# Patient Record
Sex: Female | Born: 1980 | Race: Black or African American | Hispanic: No | Marital: Single | Smoking: Current every day smoker
Health system: Southern US, Community
[De-identification: ages and names within clinical notes are randomized; demographics above are authoritative.]

## PROBLEM LIST (undated history)

## (undated) DIAGNOSIS — L309 Dermatitis, unspecified: Secondary | ICD-10-CM

## (undated) DIAGNOSIS — Z8619 Personal history of other infectious and parasitic diseases: Secondary | ICD-10-CM

## (undated) DIAGNOSIS — K219 Gastro-esophageal reflux disease without esophagitis: Secondary | ICD-10-CM

## (undated) DIAGNOSIS — O139 Gestational [pregnancy-induced] hypertension without significant proteinuria, unspecified trimester: Secondary | ICD-10-CM

## (undated) DIAGNOSIS — M1990 Unspecified osteoarthritis, unspecified site: Secondary | ICD-10-CM

## (undated) DIAGNOSIS — I8390 Asymptomatic varicose veins of unspecified lower extremity: Secondary | ICD-10-CM

## (undated) DIAGNOSIS — R638 Other symptoms and signs concerning food and fluid intake: Secondary | ICD-10-CM

## (undated) DIAGNOSIS — Z973 Presence of spectacles and contact lenses: Secondary | ICD-10-CM

## (undated) DIAGNOSIS — R51 Headache: Secondary | ICD-10-CM

## (undated) DIAGNOSIS — I10 Essential (primary) hypertension: Secondary | ICD-10-CM

## (undated) HISTORY — PX: THERAPEUTIC ABORTION: SHX798

## (undated) HISTORY — PX: WISDOM TOOTH EXTRACTION: SHX21

## (undated) HISTORY — PX: ADENOIDECTOMY: SUR15

## (undated) HISTORY — DX: Personal history of other infectious and parasitic diseases: Z86.19

## (undated) HISTORY — PX: CHOLECYSTECTOMY, LAPAROSCOPIC: SHX56

## (undated) HISTORY — PX: TONSILLECTOMY: SUR1361

## (undated) HISTORY — DX: Other symptoms and signs concerning food and fluid intake: R63.8

## (undated) HISTORY — DX: Gestational (pregnancy-induced) hypertension without significant proteinuria, unspecified trimester: O13.9

## (undated) HISTORY — DX: Asymptomatic varicose veins of unspecified lower extremity: I83.90

---
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
## 1998-12-20 ENCOUNTER — Inpatient Hospital Stay (HOSPITAL_COMMUNITY): Admission: AD | Admit: 1998-12-20 | Discharge: 1998-12-20 | Payer: Self-pay | Admitting: Obstetrics & Gynecology

## 1999-01-18 ENCOUNTER — Other Ambulatory Visit: Admission: RE | Admit: 1999-01-18 | Discharge: 1999-01-18 | Payer: Self-pay | Admitting: Obstetrics and Gynecology

## 1999-03-25 ENCOUNTER — Emergency Department (HOSPITAL_COMMUNITY): Admission: EM | Admit: 1999-03-25 | Discharge: 1999-03-25 | Payer: Self-pay | Admitting: Emergency Medicine

## 2000-05-11 ENCOUNTER — Emergency Department (HOSPITAL_COMMUNITY): Admission: EM | Admit: 2000-05-11 | Discharge: 2000-05-11 | Payer: Self-pay | Admitting: Emergency Medicine

## 2000-05-11 ENCOUNTER — Encounter: Payer: Self-pay | Admitting: Emergency Medicine

## 2001-07-16 ENCOUNTER — Emergency Department (HOSPITAL_COMMUNITY): Admission: EM | Admit: 2001-07-16 | Discharge: 2001-07-16 | Payer: Self-pay | Admitting: Physical Therapy

## 2002-05-01 ENCOUNTER — Emergency Department (HOSPITAL_COMMUNITY): Admission: EM | Admit: 2002-05-01 | Discharge: 2002-05-01 | Payer: Self-pay | Admitting: Emergency Medicine

## 2002-06-21 ENCOUNTER — Inpatient Hospital Stay (HOSPITAL_COMMUNITY): Admission: AD | Admit: 2002-06-21 | Discharge: 2002-06-21 | Payer: Self-pay | Admitting: *Deleted

## 2002-09-05 ENCOUNTER — Inpatient Hospital Stay (HOSPITAL_COMMUNITY): Admission: AD | Admit: 2002-09-05 | Discharge: 2002-09-05 | Payer: Self-pay | Admitting: Obstetrics and Gynecology

## 2002-09-23 ENCOUNTER — Ambulatory Visit (HOSPITAL_COMMUNITY): Admission: RE | Admit: 2002-09-23 | Discharge: 2002-09-23 | Payer: Self-pay | Admitting: Obstetrics & Gynecology

## 2002-09-23 ENCOUNTER — Encounter: Payer: Self-pay | Admitting: Obstetrics & Gynecology

## 2002-12-20 ENCOUNTER — Encounter: Payer: Self-pay | Admitting: Obstetrics & Gynecology

## 2002-12-20 ENCOUNTER — Ambulatory Visit (HOSPITAL_COMMUNITY): Admission: RE | Admit: 2002-12-20 | Discharge: 2002-12-20 | Payer: Self-pay | Admitting: Obstetrics & Gynecology

## 2002-12-29 ENCOUNTER — Inpatient Hospital Stay (HOSPITAL_COMMUNITY): Admission: AD | Admit: 2002-12-29 | Discharge: 2002-12-29 | Payer: Self-pay | Admitting: Obstetrics & Gynecology

## 2002-12-31 ENCOUNTER — Inpatient Hospital Stay (HOSPITAL_COMMUNITY): Admission: AD | Admit: 2002-12-31 | Discharge: 2002-12-31 | Payer: Self-pay | Admitting: Obstetrics

## 2003-01-16 ENCOUNTER — Ambulatory Visit (HOSPITAL_COMMUNITY): Admission: RE | Admit: 2003-01-16 | Discharge: 2003-01-16 | Payer: Self-pay | Admitting: Obstetrics & Gynecology

## 2003-01-16 ENCOUNTER — Encounter: Payer: Self-pay | Admitting: Obstetrics & Gynecology

## 2003-02-03 ENCOUNTER — Observation Stay (HOSPITAL_COMMUNITY): Admission: AD | Admit: 2003-02-03 | Discharge: 2003-02-04 | Payer: Self-pay | Admitting: Obstetrics and Gynecology

## 2003-02-12 ENCOUNTER — Inpatient Hospital Stay (HOSPITAL_COMMUNITY): Admission: AD | Admit: 2003-02-12 | Discharge: 2003-02-12 | Payer: Self-pay | Admitting: Obstetrics & Gynecology

## 2003-02-13 ENCOUNTER — Inpatient Hospital Stay (HOSPITAL_COMMUNITY): Admission: AD | Admit: 2003-02-13 | Discharge: 2003-02-16 | Payer: Self-pay | Admitting: Obstetrics

## 2004-02-08 ENCOUNTER — Emergency Department (HOSPITAL_COMMUNITY): Admission: EM | Admit: 2004-02-08 | Discharge: 2004-02-08 | Payer: Self-pay | Admitting: Emergency Medicine

## 2004-02-22 ENCOUNTER — Emergency Department (HOSPITAL_COMMUNITY): Admission: EM | Admit: 2004-02-22 | Discharge: 2004-02-22 | Payer: Self-pay | Admitting: Emergency Medicine

## 2004-05-30 HISTORY — PX: CHOLECYSTECTOMY, LAPAROSCOPIC: SHX56

## 2004-06-04 ENCOUNTER — Emergency Department (HOSPITAL_COMMUNITY): Admission: EM | Admit: 2004-06-04 | Discharge: 2004-06-04 | Payer: Self-pay | Admitting: Emergency Medicine

## 2004-06-05 ENCOUNTER — Ambulatory Visit (HOSPITAL_COMMUNITY): Admission: RE | Admit: 2004-06-05 | Discharge: 2004-06-05 | Payer: Self-pay | Admitting: Emergency Medicine

## 2004-06-11 ENCOUNTER — Inpatient Hospital Stay (HOSPITAL_COMMUNITY): Admission: EM | Admit: 2004-06-11 | Discharge: 2004-06-14 | Payer: Self-pay | Admitting: Emergency Medicine

## 2004-06-11 ENCOUNTER — Encounter (INDEPENDENT_AMBULATORY_CARE_PROVIDER_SITE_OTHER): Payer: Self-pay | Admitting: Specialist

## 2004-08-08 ENCOUNTER — Emergency Department (HOSPITAL_COMMUNITY): Admission: EM | Admit: 2004-08-08 | Discharge: 2004-08-09 | Payer: Self-pay | Admitting: Emergency Medicine

## 2006-05-06 ENCOUNTER — Inpatient Hospital Stay (HOSPITAL_COMMUNITY): Admission: AD | Admit: 2006-05-06 | Discharge: 2006-05-07 | Payer: Self-pay | Admitting: Family Medicine

## 2006-05-30 HISTORY — PX: BREAST CYST EXCISION: SHX579

## 2006-09-26 ENCOUNTER — Ambulatory Visit (HOSPITAL_COMMUNITY): Admission: EM | Admit: 2006-09-26 | Discharge: 2006-09-26 | Payer: Self-pay | Admitting: Emergency Medicine

## 2006-11-02 ENCOUNTER — Emergency Department (HOSPITAL_COMMUNITY): Admission: EM | Admit: 2006-11-02 | Discharge: 2006-11-02 | Payer: Self-pay | Admitting: Emergency Medicine

## 2007-04-07 ENCOUNTER — Inpatient Hospital Stay (HOSPITAL_COMMUNITY): Admission: AD | Admit: 2007-04-07 | Discharge: 2007-04-07 | Payer: Self-pay | Admitting: Obstetrics & Gynecology

## 2007-08-10 ENCOUNTER — Inpatient Hospital Stay (HOSPITAL_COMMUNITY): Admission: AD | Admit: 2007-08-10 | Discharge: 2007-08-10 | Payer: Self-pay | Admitting: Obstetrics & Gynecology

## 2008-01-25 ENCOUNTER — Emergency Department (HOSPITAL_COMMUNITY): Admission: EM | Admit: 2008-01-25 | Discharge: 2008-01-25 | Payer: Self-pay | Admitting: Emergency Medicine

## 2008-03-21 ENCOUNTER — Inpatient Hospital Stay (HOSPITAL_COMMUNITY): Admission: AD | Admit: 2008-03-21 | Discharge: 2008-03-21 | Payer: Self-pay | Admitting: Obstetrics and Gynecology

## 2008-04-03 ENCOUNTER — Inpatient Hospital Stay (HOSPITAL_COMMUNITY): Admission: AD | Admit: 2008-04-03 | Discharge: 2008-04-03 | Payer: Self-pay | Admitting: Obstetrics and Gynecology

## 2008-07-23 ENCOUNTER — Inpatient Hospital Stay (HOSPITAL_COMMUNITY): Admission: AD | Admit: 2008-07-23 | Discharge: 2008-07-23 | Payer: Self-pay | Admitting: Obstetrics and Gynecology

## 2008-08-10 ENCOUNTER — Inpatient Hospital Stay (HOSPITAL_COMMUNITY): Admission: AD | Admit: 2008-08-10 | Discharge: 2008-08-11 | Payer: Self-pay | Admitting: Obstetrics and Gynecology

## 2008-09-04 ENCOUNTER — Inpatient Hospital Stay (HOSPITAL_COMMUNITY): Admission: AD | Admit: 2008-09-04 | Discharge: 2008-09-04 | Payer: Self-pay | Admitting: Obstetrics and Gynecology

## 2008-09-06 ENCOUNTER — Inpatient Hospital Stay (HOSPITAL_COMMUNITY): Admission: AD | Admit: 2008-09-06 | Discharge: 2008-09-06 | Payer: Self-pay | Admitting: Obstetrics and Gynecology

## 2008-09-17 ENCOUNTER — Inpatient Hospital Stay (HOSPITAL_COMMUNITY): Admission: AD | Admit: 2008-09-17 | Discharge: 2008-09-19 | Payer: Self-pay | Admitting: Obstetrics and Gynecology

## 2009-04-14 ENCOUNTER — Emergency Department (HOSPITAL_COMMUNITY): Admission: EM | Admit: 2009-04-14 | Discharge: 2009-04-14 | Payer: Self-pay | Admitting: Emergency Medicine

## 2010-01-28 ENCOUNTER — Emergency Department (HOSPITAL_COMMUNITY): Admission: EM | Admit: 2010-01-28 | Discharge: 2010-01-28 | Payer: Self-pay | Admitting: Emergency Medicine

## 2010-09-08 LAB — COMPREHENSIVE METABOLIC PANEL
ALT: 15 U/L (ref 0–35)
AST: 24 U/L (ref 0–37)
BUN: 12 mg/dL (ref 6–23)
CO2: 23 mEq/L (ref 19–32)
Calcium: 9.5 mg/dL (ref 8.4–10.5)
Chloride: 104 mEq/L (ref 96–112)
Potassium: 3.6 mEq/L (ref 3.5–5.1)
Sodium: 135 mEq/L (ref 135–145)
Total Bilirubin: 0.1 mg/dL — ABNORMAL LOW (ref 0.3–1.2)

## 2010-09-08 LAB — URINALYSIS, ROUTINE W REFLEX MICROSCOPIC
Bilirubin Urine: NEGATIVE
Hgb urine dipstick: NEGATIVE
Ketones, ur: 15 mg/dL — AB
Specific Gravity, Urine: 1.02 (ref 1.005–1.030)
Urobilinogen, UA: 1 mg/dL (ref 0.0–1.0)
pH: 7 (ref 5.0–8.0)

## 2010-09-08 LAB — URINE MICROSCOPIC-ADD ON

## 2010-09-08 LAB — CBC
Hemoglobin: 10 g/dL — ABNORMAL LOW (ref 12.0–15.0)
Hemoglobin: 10.1 g/dL — ABNORMAL LOW (ref 12.0–15.0)
Hemoglobin: 8.3 g/dL — ABNORMAL LOW (ref 12.0–15.0)
MCHC: 33.8 g/dL (ref 30.0–36.0)
MCV: 88.9 fL (ref 78.0–100.0)
MCV: 89.2 fL (ref 78.0–100.0)
Platelets: 243 10*3/uL (ref 150–400)
RBC: 3.33 MIL/uL — ABNORMAL LOW (ref 3.87–5.11)
RDW: 15.6 % — ABNORMAL HIGH (ref 11.5–15.5)
RDW: 15.9 % — ABNORMAL HIGH (ref 11.5–15.5)
WBC: 10 10*3/uL (ref 4.0–10.5)
WBC: 8.8 10*3/uL (ref 4.0–10.5)

## 2010-09-08 LAB — URIC ACID: Uric Acid, Serum: 4.1 mg/dL (ref 2.4–7.0)

## 2010-09-09 LAB — STREP B DNA PROBE

## 2010-09-09 LAB — GC/CHLAMYDIA PROBE AMP, GENITAL: GC Probe Amp, Genital: NEGATIVE

## 2010-09-14 LAB — URINALYSIS, ROUTINE W REFLEX MICROSCOPIC
Bilirubin Urine: NEGATIVE
Glucose, UA: NEGATIVE mg/dL
Hgb urine dipstick: NEGATIVE
Ketones, ur: NEGATIVE mg/dL
Nitrite: NEGATIVE
Urobilinogen, UA: 0.2 mg/dL (ref 0.0–1.0)

## 2010-09-17 ENCOUNTER — Other Ambulatory Visit: Payer: Self-pay | Admitting: Obstetrics and Gynecology

## 2010-09-17 DIAGNOSIS — N63 Unspecified lump in unspecified breast: Secondary | ICD-10-CM

## 2010-09-17 LAB — RPR: RPR: NONREACTIVE

## 2010-09-17 LAB — ANTIBODY SCREEN: Antibody Screen: NEGATIVE

## 2010-09-17 LAB — HEPATITIS B SURFACE ANTIGEN: Hepatitis B Surface Ag: NEGATIVE

## 2010-09-17 LAB — ABO/RH: RH Type: POSITIVE

## 2010-09-22 ENCOUNTER — Other Ambulatory Visit: Payer: Self-pay

## 2010-09-24 ENCOUNTER — Ambulatory Visit
Admission: RE | Admit: 2010-09-24 | Discharge: 2010-09-24 | Disposition: A | Discharge status: Home / Self Care | Payer: Medicaid Other | Source: Ambulatory Visit | Attending: Obstetrics and Gynecology | Admitting: Obstetrics and Gynecology

## 2010-09-24 DIAGNOSIS — N63 Unspecified lump in unspecified breast: Secondary | ICD-10-CM

## 2010-10-12 NOTE — H&P (Signed)
NAMEBRISHA, MCCABE                  ACCOUNT NO.:  [id number]   MEDICAL RECORD NO.:  [id number]          PATIENT TYPE:  INP   LOCATION:  9163                          FACILITY:  [facility]   PHYSICIAN:  Naima A. Dillard, M.D. DATE OF BIRTH:  Sep 11, 1980   DATE OF ADMISSION:  09/17/2008  DATE OF DISCHARGE:                              HISTORY & PHYSICAL   HISTORY OF PRESENT ILLNESS:  Connie [name] is a 30 year old gravida 3, para  1 at 39.[redacted] weeks gestation who is followed by the physicians at [facility] who presents this evening for induction of labor  secondary to [facility].  Ms. Wegman pregnancy is remarkable for increased  BMI, history of left breast infection with a ruptured abscess, asthma,  history of hypertension, history of anemia, positive GBS.  She is  missing her whole health history in these records.   DICTATION ENDED      Eulogio Bear, CNM      Naima A. Normand Sloop, M.D.  Electronically Signed    JM/MEDQ  D:  09/18/2008  T:  09/18/2008  Job:  [id number]

## 2010-10-12 NOTE — H&P (Signed)
NAMEJESLIE, LOWE                  ACCOUNT NO.:  [id number]   MEDICAL RECORD NO.:  [id number]          PATIENT TYPE:  INP   LOCATION:                                [facility]   PHYSICIAN:  Janine Limbo, M.D.DATE OF BIRTH:  10-26-1980   DATE OF ADMISSION:  09/17/2008  DATE OF DISCHARGE:                              HISTORY & PHYSICAL   DATE OF ADMISSION:  September 17, 2008.   HISTORY OF PRESENT ILLNESS:  Ms. Manalo is a 30 year old female, gravida  3, para 1-0-1-1, who presents at 43 weeks' gestation (EDC is September 24, 2008).  The patient has been followed at the [facility].  This  pregnancy has been complicated by hypertension.  The patient also is  obese.  The patient has had antenatal testing and the testing has been  reassuring.  The patient had an ultrasound performed on September 15, 2008,  which showed a single intrauterine gestation.  The fetal head was in a  vertex presentation.  The biophysical profile score was 8/8.  The  amniotic fluid volume was normal.  A 24-hour urine study showed 195 mg  of protein per day.  The patient is currently taking Aldomet 500 mg  twice each day.   OBSTETRICAL HISTORY:  The patient had a vaginal delivery at 45 weeks'  gestation in 2004, that infant weighed 6 pounds and 9 ounces.  She had  an elective pregnancy termination in 1996.   DRUG ALLERGIES:  DILAUDID.  The patient is also allergic to LATEX.   PAST MEDICAL HISTORY:  The patient has a history of seasonal allergies.  She has a history of asthma, but has done well during this pregnancy.  She uses ProAir as needed.  The patient has had a breast infection in  the past and she has had gastroenteritis in the past.  The patient has  had her adenoids removed at age 62.  Her tonsils were removed at age 32.  Her gallbladder was removed in 2007.  The patient had a mass removed  from her left breast in 2008.   SOCIAL  HISTORY:  The patient smokes cigarettes.  She denies alcohol and  other recreational drug uses.   REVIEW OF SYSTEMS:  Normal pregnancy complaints.   FAMILY HISTORY:  Noncontributory.   PHYSICAL EXAMINATION:  VITAL SIGNS:  Height is 5 feet 9 inches and  weight is 277 pounds.  HEENT:  Within normal limits.  CHEST:  Clear.  HEART:  Regular rate and rhythm.  BREASTS:  Without masses.  ABDOMEN:  Gravid with fundal height of 38 cm.  EXTREMITIES:  Grossly normal.  NEUROLOGIC:  Grossly normal.  PELVIC:  Cervix was fingertip dilated, 50% effaced, and -3 in station.   LABORATORY VALUES:  Blood type is A+, antibody screen negative, VDRL  nonreactive, rubella immune, HBsAg negative, HIV nonreactive.  Third  trimester beta strep is negative.   ASSESSMENT:  1. A 39 weeks' gestation.  2. Hypertension.  3. Obesity.  4. Asthma.  5. Unfavorable  cervix.   PLAN:  The patient will be admitted for cervical ripening, and then we  will proceed with induction on the morning of September 18, 2008.      Janine Limbo, M.D.  Electronically Signed     AVS/MEDQ  D:  09/15/2008  T:  09/16/2008  Job:  [id number]

## 2010-10-12 NOTE — H&P (Signed)
NAMEMYKIAH, SCHMUCK                  ACCOUNT NO.:  [id number]   MEDICAL RECORD NO.:  [id number]          PATIENT TYPE:  INP   LOCATION:  9163                          [facility]  [facility]   PHYSICIAN:  Naima A. Dillard, M.D. DATE OF BIRTH:  [birth year]   DATE OF ADMISSION:  09/17/2008  DATE OF DISCHARGE:                              HISTORY & PHYSICAL   CHIEF COMPLAINT AND HISTORY OF THE PRESENT ILLNESS:  The patient is a 30-  year-old gravida 3, para 1 who is 39.[redacted] weeks gestation and has been  followed by the doctors at [facility].  She presents today  for induction of labor secondary to [facility].  Her pregnancy is remarkable  for:  1. BMI.  2. History of anemia and current anemia.  3. History of asthma and current asthma.  4. History of pregnancy-induced hypertension and current pregnancy-      induced hypertension.  5. The patient is a smoker.  6. Status post cholecystectomy.  7. History of left breast surgery in 2008 and left breast ruptured      abscess in 2009.  8. History of STDs including Chlamydia and gonorrhea; none current      during this pregnancy.  9. History of positive GBS; currently negative GBS this pregnancy.  10.The patient is a smoker.   PRENATAL LABORATORY DATA:  The patient's prenatal labs include:  initial  hemoglobin of 10.3 and hematocrit 31.1.  Blood type A+, antibody screen  negative.  RPR nonreactive.  Rubella immune.  Hepatitis B negative.  HIV  nonreactive.  First trimester screen was normal.  AFP was negative.  The  final cultures done at 34 weeks were negative for beta Strep, Chlamydia  and gonorrhea.   CURRENT MEDICATIONS:  The patient's medications include:  1. Prenatal vitamin daily.  2. Protonix 40 mg daily.  3. Phenergan 25 mg as needed.  4. Albuterol inhaler.  5. Methyldopa 500 mg twice daily, but she stopped taking this about a      week ago.  6. The patient has also been prescribed Flexeril and Vicodin for      pelvic pain and  pressure, but she has not been taking these.   HISTORY OF THE PRESENT PREGNANCY:  The patient presented for prenatal  care in September beginning with the new OB interview.  She did have an  ultrasound and had her gestation adjusted down to a younger age.  So,  she was brought in at 9 weeks when she first had her interview.  She was  10 weeks at the new OB exam.  At that time she was on antibiotics for a  soft tissue infection in her left breast, which had a ruptured abscess  near the nipple.  She had a normal Pap back in May 2009 so that was not  repeated.  She was found to be anemic already and was encouraged to  start her iron twice a day, and to be pretty proactive in preventing  constipation since she suffers with chronic constipation.  At 12 weeks  she had some itching  and was using over-the-counter month Monistat-7.  Wet prep showed that she had a yeast infection and she was switched over  to [employer], was also given some Diflucan and a prescription for  Phenergan and Protonix.  She had her first trimester screen that was  normal.  At 15 weeks she began to have chronic pelvic pain, which  persisted through the rest of the pregnancy resulting in multiple visits  to the Maternity Admissions Unit.  Her AFP was done at 15 weeks and was  normal.  She got the H1N1 vaccine at that time as well.  At 16 weeks she  was given a prescription for Zofran and some Vicodin for her pelvic  pain.  At 19 weeks she had an anatomy ultrasound that showed size  consistent with dates, cervix 3.49 cm, all anatomy normal, and an  anterior placenta with a three-vessel cord.  She was suffering from  constipation and was advised to take MiraLax.  She was complaining of  numbness in her left arm from work and was referred to an orthopedic  specialist for evaluation, and given a work note.  At 26 weeks she was  complaining of pelvic pain and pressure, and a fetal fibronectin was  done, which was negative.  Her  1-hour Glucola was done and it was normal  at 117.  At 33 weeks she was seen in the Maternity Admissions Unit for  contractions and pelvic pain.  At 34 weeks she was seen in the Maternity  Admissions Unit for pelvic pain.  She had cultures that were done, beta  Strep, chlamydia and gonorrhea that were negative.  She had another  ultrasound that showed the baby was vertex, fluid was normal and  estimated fetal weight was in the 67th percentile.  At 36 weeks she was  having problems with her blood pressure and a preeclampsia panel was  drawn; it was all normal.  A 24-hour urine was done and it was fine.  She was complaining of headaches.  She had a normal exam with normal  reflexes and she was started on Aldomet 250 mg twice a day by Dr.  Stefano Gaul; and, that was at 36 weeks.  Later on at 36 weeks she was  reminded again to please take her iron and her Aldomet was increased to  500 twice a day.  There was another ultrasound done, which showed a  vertex presentation, normal fluid, a BPP of 8/8, the cervix was  fingertip, 50%, and -30; that was on September 01, 2008.  however, on September 04, 2008 she went to the Maternity Admissions Unit with high blood  pressure and pelvic pain; and,  the evening of September 05, 2008 into the  morning of September 06, 2008.  She was seen again in the Maternity  Admissions Unit requesting induction of labor at 37 weeks due to the  pelvic pain and pressure.  She was evaluated and had a reactive NST, a  BPP of 8/8 and an AFI of 20.7; and, was being monitored when she left  against medical advice.  She has been followed since then 1-2 times a  week in the office for the past week-and-a-half until this evening when  she presented for induction of labor at 39 weeks.   PAST OBSTETRICAL HISTORY:  This is the patient's third pregnancy.  Her  first pregnancy was in 1996 and was terminated in the first trimester  without any complications.  Second pregnancy was completed in  September  2004 with the birth of a son at 55 weeks.  She was induced for her blood  pressure and had a spontaneous vaginal delivery.  There was some  meconium in the fluid.  Pregnancy #3 is the current pregnancy.   ALLERGIES:  THE PATIENT IS ALLERGIC TO DILAUDID AND LATEX.   PAST MEDICAL HISTORY:  The patient has used Depo-Provera for birth  control in the past.  She has a history of Chlamydia and gonorrhea in  2001.  She had a history of positive GBS with her son in 2004.  She has  a history of anemia, high blood pressure, asthma, gastrointestinal  problems, and smoking.   PAST SURGICAL HISTORY:  1. The patient had her adenoids removed at age 35.  2. The patient had her tonsils removed at age 39.  3. The patient had termination of a pregnancy at age 67.  14. The patient had her gallbladder removed in 2007.  5. The patient had a lumpectomy of her left breast in 2008.   FAMILY HISTORY:  The patient's maternal grandmother had a heart attack.  Her maternal uncle also had heart attack.  Her maternal grandmother has  chronic hypertension and her maternal grandmother also has varicose  veins.  Her paternal grandmother has lung cancer.  Her mother has  obsessive compulsive disorder with depression.  Her maternal uncle is a  crack cocaine addict.  Her mother and father smoke as do her paternal  aunts.   GENETIC HISTORY:  The genetic history is noncontributory.  I do not see  a sickle cell screen in the records.  The AFP was normal and the first  trimester screen was normal.   SOCIAL HISTORY:  The patient is a single [place] American woman with a  high school diploma.  She is taking some college classes.  The father of  the baby is listed as Balinda Quails. Alexander Adger.  He has a GED.  I have  not met him and do not know any thing more about him other than he is  also a Consulting civil engineer.  She is a smoker, and she denies the use of alcohol or  street drugs during this pregnancy.  She does not state her  religion.   PHYSICAL EXAMINATION:  GENERAL APPEARANCE:  The patient is alert and  oriented.  HEENT:  The head, eyes, ears, nose, and throat are within normal limits.  LUNGS:  Her lungs do not sound good this evening.  She has been  suffering with her asthma using her inhaler 2-3 times a day.  She is  coughing; this is a nonproductive cough.  She does have some  adventitious breath sounds in the left lung.  The right lung is clear.  VITAL SIGNS:  Temperature 98.2, pulse 73, respiratory rate 22 and her  blood pressure has ranged in the 140s over 90s to as high as 164/107.  NEUROLOGIC AND GENERAL EXAMINATIONS:  The patient does not have any CNS  changes this evening.  She denies headache, visual changes, chest pain  and epigastric pain.  She does have numbness in her hands, but no  swelling.  ABDOMEN:  The patient's abdomen is gravid and appropriate for  gestational age.  It is soft to palpation.  EXTREMITIES:  The patient's DTRs are normal.  She has negative clonus.  She has maybe trace edema in her left lower extremity.  VAGINAL EXAMINATION:  She does not have any bleeding or leaking from her  vagina.  Fetal heart rate is normal and reactive with a baseline of 135.  Contractions are very irregular, mild and some irritability is noted on  the tracing.   IMPRESSION:  The patient is a 30 year old gravida 3, para 1, 0, 1, 1 at  39.0 weeks with:  1. Hypertension.  2. Asthma.  3. Negative group B Streptococcus.  4. Anemia.  5. Cough.  6. Unfavorable cervix per examination by Dr. Stefano Gaul in the office;      and, cervical exam is deferred tonight   PLAN:  1. Admit for induction of labor beginning with Cytotec and progressing      to low-dose Pitocin in the morning.  2. Preeclampsia panel was ordered with an LDH, uric acid and complete      metabolic panel.  3. Routine admission labs.  4. Since she has not taken her blood pressure medication and a week,      500 mg of Aldomet by mouth  times one was ordered by Dr. Normand Sloop.  5. Albuterol nebulizer treatment per respiratory therapy and an      inhaler to be kept at the bedside.  6. Cough medicine.  7. Sleep medicine.  8. Cytotec every 4 hours per protocol overnight.  9. Intravenous pain medicine as needed.     Janna Melsness, CNM      Naima A. Normand Sloop, M.D.  Electronically Signed   JM/MEDQ  D:  09/18/2008  T:  09/18/2008  Job:  [id number]

## 2010-10-15 NOTE — Op Note (Signed)
Connie [name], Connie [name]                  ACCOUNT NO.:  [id number]   MEDICAL RECORD NO.:  [id number]          PATIENT TYPE:  INP   LOCATION:  0113                         FACILITY:  [facility]   PHYSICIAN:  Sharlet Salina T. Hoxworth, M.D.DATE OF BIRTH:  Dec 06, 1980   DATE OF PROCEDURE:  09/26/2006  DATE OF DISCHARGE:                               OPERATIVE REPORT   PRE AND POSTOPERATIVE DIAGNOSIS:  Left breast abscess.   SURGICAL PROCEDURE:  I&D left breast abscess.   SURGEON:  Lorne Skeens. Hoxworth, M.D.   ANESTHESIA:  General.   BRIEF HISTORY:  Connie [name] is a 30 year old female who presents with  five days of increasing pain and swelling, the subareolar area of the  left breast.  Examination reveals a good-sized approximately 2.5 cm  abscess just beneath and inferior to the left nipple.  She has no  history of any similar problems.  Options for drainage under local or  general anesthesia were discussed and she has refused drainage under  local anesthesia.  She is therefore brought to the operating room for  drainage under general anesthesia.  Risks of bleeding, infection,  anesthetic complications were discussed understood.   DESCRIPTION OF PROCEDURE:  The patient brought operating room placed in  supine position on operating table and general anesthesia induced.  The  left breast was sterilely prepped and draped.  Made a curvilinear  incision just beneath the left nipple and areola and the abscess cavity  entered and a large amount of purulent material drained.  This was  cultured.  The cavity was widely unroofed, irrigated and hemostasis  obtained.  The soft tissue was infiltrated with Marcaine.  The cavity  was packed with iodoform gauze and dry sterile dressing was applied.  She was taken recovery in good condition.      Lorne Skeens. Hoxworth, M.D.  Electronically Signed     BTH/MEDQ  D:  09/26/2006  T:  09/27/2006  Job:  [id number]

## 2010-10-15 NOTE — Discharge Summary (Signed)
[name], [name]                  ACCOUNT NO.:  [id number]   MEDICAL RECORD NO.:  [id number]          PATIENT TYPE:  INP   LOCATION:  0456                         FACILITY:  [facility]   PHYSICIAN:  Sandria Bales. Ezzard Standing, M.D.  DATE OF BIRTH:  1980-10-22   DATE OF ADMISSION:  06/11/2004  DATE OF DISCHARGE:  06/14/2004                                 DISCHARGE SUMMARY   DISCHARGE DIAGNOSES:  1.  Chronic cholecystitis with cholelithiasis.  2.  Choledocholithiasis.  3.  Elevated liver functions secondary to choledocholithiasis  4.  History of asthma.  5.  Smokes 1/2 pack of cigarettes a day.   OPERATION PERFORMED:  The patient had a laparoscopic cholecystectomy and  intraoperative cholangiogram, common bile duct exploration with a #4 Fogarty  on June 12, 2004.   HISTORY OF PRESENT ILLNESS:  This is a 30 year old black female with no  primary medical physician who has had vague abdominal pain since August of  2005.  She went to the [facility] emergency room in September, 2005 but  left with no specific diagnosis.  Represented on June 04, 2004 and an  ultrasound on June 05, 2004 revealed multiple gallstones. She now presents  on June 11, 2004 with increasing abdominal pain. Her temperature was  98.9, she had some right upper quadrant soreness on physical exam. Her white  blood count was 8800.   Other past medical history reveals she has asthma for which she takes  albuterol nebulizers and inhalers occasionally. She smokes 1/2 pack of  cigarettes a day and knows this is bad for her health but she has no primary  care physician who manages her care.   On June 12, 2004 she was taken to the operating room where she underwent  a laparoscopic cholecystectomy with intraoperative cholangiogram. She had  what was felt to be a single stone in her distal common bile duct which was  cleared with a #4 Fogarty.  The day after surgery, June 13, 2004, her temperature was 97.9, but she  bumped her liver functions up to an AST of 729 and an ALT of 786, but a  total bilirubin of 0.8, so I kept her an additional day to make sure her  liver functions returned to normal. Today is June 14, 2004.  Her AST is  300, her ALT is 505, alkaline phosphatase 102, total bilirubin 0.5.  She  feels good and is ready for discharge.   DISCHARGE INSTRUCTIONS:  Vicodin for pain. She is to stay on a low fat diet.  She can shower later today. She should have no heavy lifting for 2 or 3  days.  I have also instructed her with a history of asthma and smoking that  she would be best served by having a primary medical physician that she  needs to identify. She will make an appointment in the office in 2 weeks.   DISCHARGE CONDITION:  Good.     Connie [name]   DHN/MEDQ  D:  06/14/2004  T:  06/14/2004  Job:  [id number]

## 2010-10-15 NOTE — Op Note (Signed)
Connie [name], Connie [name]                  ACCOUNT NO.:  [id number]   MEDICAL RECORD NO.:  [id number]          PATIENT TYPE:  INP   LOCATION:  0456                         FACILITY:  [facility]   PHYSICIAN:  Sandria Bales. Ezzard Standing, M.D.  DATE OF BIRTH:  1981-02-08   DATE OF PROCEDURE:  06/12/2004  DATE OF DISCHARGE:                                 OPERATIVE REPORT   PREOPERATIVE DIAGNOSIS:  Cholecystitis and cholelithiasis.   POSTOPERATIVE DIAGNOSIS:  Chronic cholecystitis and cholelithiasis,  choledocholithiasis.   OPERATION PERFORMED:  1.  Laparoscopic cholecystectomy.  2.  Intraoperative cholangiogram.  3.  Common bile duct exploration with #4 Fogarty catheter.   SURGEON:  Sandria Bales. Ezzard Standing, M.D.   ASSISTANT:  Sharlet Salina T. Hoxworth, M.D.   ANESTHESIA:  General endotracheal.   ESTIMATED BLOOD LOSS:  Minimal.   INDICATIONS FOR PROCEDURE:  Connie [name] is a 30 year old black female who was  admitted last evening, June 11, 2004, with what looked like  cholecystitis.  She now comes to the operating room for attempted  laparoscopic cholecystectomy.  I discussed with her the indications and  potential complications of the operation.  Potential complications include  but are not limited to bleeding, infection, bile duct injury and the  possibility of open surgery.   DESCRIPTION OF PROCEDURE:  With the patient in supine position, given  general endotracheal anesthetic.  She was also on Ancef as antibiotic and  PAS stockings in place.  Her abdomen was prepped with Betadine solution adn  sterilely draped.  Four abdominal trocars were placed.  A 12 mm Hasson  trocar in the infraumbilical incision, a 10 mm Ethicon trocar in the  subxiphoid and  two 5 right subcostal trocars.   Abdominal exploration carried out and revealed the stomach unremarkable.  Both lobes of the liver were unremarkable.  There was adhesion to the right  colon but otherwise there was no specific mass or lesion.  The gallbladder  was noted to be distended.  It was grasped and rotated cephalad.  Sharp  dissection carried down along the gallbladder cystic duct junction.  Had  quite a bit of fibrous tissue both of the duodenum stuck up to the  gallbladder and around the cystic duct.  I finally isolated the cystic  artery which was clipped and divided it.  I isolated the cystic duct, placed  a clip on the gallbladder side and shot an intraoperative cholangiogram.   The intraoperative cholangiogram was obtained using a cut off taut catheter  inserted through a cut cystic duct and secured with an angioclip.  The  catheter was inserted and placed in to the cut cystic duct and secured with  an EndoClip.  I used half strength Hypaque solution, injected contrast under  fluoroscopy.  I noted a dilated common bile duct.  It went up both radicals  but did not empty into the duodenum and there appeared to be a small  meniscal sac consistent with probable choledocholithiasis.  She did have  some mildly elevated liver functions preoperatively.   I then gave her a gram of glucagon and waited  two minutes and then I used a  #4 Fogarty catheter which I placed into the cut cystic duct and this was  passed down into the duodenum.  I actually could feel a pop when it went  into the duodenum.  I wonder whether I pushed a small stone or debris out of  the common bile duct.  I then blew the balloon up and pulled it back twice  and shot a second cholangiogram again with the cut off taut catheter and  used about 10 mL of half strength Hypaque.  This time the contrast filled up  the common bile duct but it was left decompressed.  It easily emptied into  the duodenum.  There was no obstructing mass or lesion and she was felt to  have a probable choledocholithiasis.  I saw no filling defect within the  common bile duct and I thought I had maybe pushed this on downstream.   The cystic duct was then triply endoclipped and divided.  The artery  had  already been clipped at the posterior branch that I clipped.  The  gallbladder was then bluntly and sharply dissected from the gallbladder bed,  prior to complete division of the gallbladder from the gallbladder bed, I  visualized the triangle of Calot.  I visualized the gallbladder bed, saw no  bleeding, no bile leak.  The gallbladder was then delivered into an  EndoCatch bag and delivered through the umbilicus.  The abdomen was then  irrigated with 1L of saline.  The gallbladder bed was then revisualized,  again looked good.  Each trocar was then removed in turn.   I closed the umbilical cord with 0 Vicryl suture which was already in place.  I closed the skin flap with a 4-0 Monocryl suture, painted each wound with  tincture of Benzoin and steri-stripped.  Sponge and needle counts were  correct at the end of this case.  The patient was then transferred to the  recovery room in good condition.     [name]   DHN/MEDQ  D:  06/12/2004  T:  06/12/2004  Job:  [id number]
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
## 2010-11-23 ENCOUNTER — Inpatient Hospital Stay (HOSPITAL_COMMUNITY)
Admission: AD | Admit: 2010-11-23 | Discharge: 2010-11-26 | DRG: 781 | Disposition: A | Discharge status: Home / Self Care | Payer: Medicaid Other | Source: Ambulatory Visit | Attending: Obstetrics and Gynecology | Admitting: Obstetrics and Gynecology

## 2010-11-23 DIAGNOSIS — IMO0002 Reserved for concepts with insufficient information to code with codable children: Secondary | ICD-10-CM | POA: Diagnosis present

## 2010-11-23 DIAGNOSIS — R51 Headache: Secondary | ICD-10-CM | POA: Diagnosis present

## 2010-11-23 DIAGNOSIS — M436 Torticollis: Secondary | ICD-10-CM | POA: Diagnosis present

## 2010-11-23 DIAGNOSIS — M542 Cervicalgia: Secondary | ICD-10-CM | POA: Diagnosis present

## 2010-11-23 DIAGNOSIS — R509 Fever, unspecified: Secondary | ICD-10-CM | POA: Diagnosis present

## 2010-11-23 DIAGNOSIS — O10019 Pre-existing essential hypertension complicating pregnancy, unspecified trimester: Secondary | ICD-10-CM | POA: Diagnosis present

## 2010-11-23 DIAGNOSIS — R1310 Dysphagia, unspecified: Secondary | ICD-10-CM | POA: Diagnosis present

## 2010-11-23 DIAGNOSIS — G039 Meningitis, unspecified: Secondary | ICD-10-CM

## 2010-11-23 DIAGNOSIS — O99891 Other specified diseases and conditions complicating pregnancy: Principal | ICD-10-CM | POA: Diagnosis present

## 2010-11-23 DIAGNOSIS — J029 Acute pharyngitis, unspecified: Secondary | ICD-10-CM | POA: Diagnosis present

## 2010-11-23 LAB — DIFFERENTIAL
Basophils Absolute: 0 10*3/uL (ref 0.0–0.1)
Basophils Relative: 0 % (ref 0–1)
Eosinophils Relative: 0 % (ref 0–5)
Lymphocytes Relative: 16 % (ref 12–46)
Monocytes Absolute: 0.7 10*3/uL (ref 0.1–1.0)
Monocytes Relative: 6 % (ref 3–12)
Neutro Abs: 9.5 10*3/uL — ABNORMAL HIGH (ref 1.7–7.7)

## 2010-11-23 LAB — URINALYSIS, ROUTINE W REFLEX MICROSCOPIC
Bilirubin Urine: NEGATIVE
Glucose, UA: NEGATIVE mg/dL
Hgb urine dipstick: NEGATIVE
Ketones, ur: 15 mg/dL — AB
pH: 7 (ref 5.0–8.0)

## 2010-11-23 LAB — CBC
HCT: 28.5 % — ABNORMAL LOW (ref 36.0–46.0)
Hemoglobin: 9.6 g/dL — ABNORMAL LOW (ref 12.0–15.0)
MCH: 28.8 pg (ref 26.0–34.0)
MCHC: 33.7 g/dL (ref 30.0–36.0)
RBC: 3.33 MIL/uL — ABNORMAL LOW (ref 3.87–5.11)

## 2010-11-24 DIAGNOSIS — R51 Headache: Secondary | ICD-10-CM

## 2010-11-24 DIAGNOSIS — R509 Fever, unspecified: Secondary | ICD-10-CM

## 2010-11-24 DIAGNOSIS — IMO0002 Reserved for concepts with insufficient information to code with codable children: Secondary | ICD-10-CM

## 2010-11-24 DIAGNOSIS — M542 Cervicalgia: Secondary | ICD-10-CM

## 2010-11-24 LAB — CBC
Hemoglobin: 9.2 g/dL — ABNORMAL LOW (ref 12.0–15.0)
MCH: 28.8 pg (ref 26.0–34.0)
MCHC: 33.8 g/dL (ref 30.0–36.0)
MCV: 85.3 fL (ref 78.0–100.0)
RBC: 3.19 MIL/uL — ABNORMAL LOW (ref 3.87–5.11)

## 2010-11-24 LAB — DIFFERENTIAL
Eosinophils Absolute: 0 10*3/uL (ref 0.0–0.7)
Lymphs Abs: 2.2 10*3/uL (ref 0.7–4.0)
Monocytes Absolute: 0.9 10*3/uL (ref 0.1–1.0)
Monocytes Relative: 7 % (ref 3–12)
Neutro Abs: 9 10*3/uL — ABNORMAL HIGH (ref 1.7–7.7)
Neutrophils Relative %: 74 % (ref 43–77)

## 2010-11-25 ENCOUNTER — Ambulatory Visit (HOSPITAL_COMMUNITY): Payer: Medicaid Other

## 2010-11-25 LAB — URINE CULTURE
Culture  Setup Time: 201206271655
Special Requests: NEGATIVE

## 2010-11-26 LAB — CBC
HCT: 27.3 % — ABNORMAL LOW (ref 36.0–46.0)
MCH: 29.1 pg (ref 26.0–34.0)
MCHC: 33.7 g/dL (ref 30.0–36.0)
MCV: 86.4 fL (ref 78.0–100.0)
RDW: 15.5 % (ref 11.5–15.5)
WBC: 6.7 10*3/uL (ref 4.0–10.5)

## 2010-11-26 LAB — CULTURE, RESPIRATORY W GRAM STAIN

## 2010-11-30 LAB — CULTURE, BLOOD (ROUTINE X 2)
Culture  Setup Time: 201206270241
Culture: NO GROWTH

## 2010-12-03 NOTE — Discharge Summary (Signed)
  Connie [name], Connie [name]                  ACCOUNT NO.:  [id number]  MEDICAL RECORD NO.:  [id number]  LOCATION:  9152                          FACILITY:  [facility]  PHYSICIAN:  Janine Limbo, M.D.DATE OF BIRTH:  08-27-1980  DATE OF ADMISSION:  11/23/2010 DATE OF DISCHARGE:  11/26/2010                              DISCHARGE SUMMARY   ADMISSION DIAGNOSES: 1. A 21 weeks' gestation. 2. Febrile illness. 3. Questionable viral illness. 4. Questionable kidney infection. 5. Asthma. 6. Anemia.  DISCHARGE DIAGNOSES: 1. A 21-week and 3-day gestation. 2. Febrile illness - resolved. 3. Streptococcus pharyngitis.  PROCEDURES THIS ADMISSION:  None.  HISTORY OF PRESENT ILLNESS:  [name] is a 30 year old female, gravida 4, para -2-0-0-2, who was admitted at 69 weeks' gestation.  The patient has been followed at the [facility] for women.  She complains of a febrile illness with increasing fatigue and malaise.  Please see her admission history and physical exam for details.  ADMISSION EXAM:  The patient was noted to be in mild distress and was noted to have a sore throat and her skin was warm to touch.  Her temperature on admission was 100.4 degrees.  Her uterus was consistent with her stated gestational age.  HOSPITAL COURSE:  The patient was admitted to the hospital and was started on pen VK.  The patient's white blood cell count on the day of discharge was 6700 and her hemoglobin was 9.2 g/dL.  LABORATORY EVALUATION:  Hemoglobin of 9.6.  Her white blood cell count was 12,200.  Her platelet count was 218,000.  The patient had a temperature as high as 102.9.  She was seen by Dr. Daiva Eves from the Infectious Disease Department.  Her antibiotics were switched to Unasyn. Her strep culture of the throat returned positive.  Her blood cultures were negative.  Her urine culture was negative.  An MRI was attempted of the brain, but was  not completed because of anxiety and claustrophobia. The patient became afebrile and was afebrile for 48 hours prior to discharge.  On the day of discharge, the patient was feeling much better.  She was tolerating a regular diet.  She was felt to be ready for discharge to home with plans for antibiotics by mouth.  DISCHARGE MEDICATIONS: 1. Augmentin 875/125 one tablet twice each day for a total of 12 days. 2. Motrin 800 mg one tablet every 8 hours as needed for mild-to-     moderate pain. 3. Percocet 5/325 one tablet every 4 hours as needed for severe pain. 4. Prenatal vitamins one each day. 5. Iron 325 mg 1 tablet twice each day for 6 weeks. 6. The patient will continue her preoperative medication.  DISCHARGE INSTRUCTIONS:  The patient will return to the office for repeat evaluation in 1 week.  She will call for questions or concerns.     Janine Limbo, M.D.     AVS/MEDQ  D:  11/26/2010  T:  11/27/2010  Job:  [id number]  cc:   Acey Lav, MD Fax: [phone]  Electronically Signed by Kirkland Hun M.D. on 12/03/2010 07:23:13 AM

## 2010-12-22 NOTE — Consult Note (Signed)
Connie [name]                  ACCOUNT NO.:  [id number]  MEDICAL RECORD NO.:  [id number]  LOCATION:  9152                          FACILITY:  [facility]  PHYSICIAN:  Acey Lav, MD  DATE OF BIRTH:  Mar 04, 1981  DATE OF CONSULTATION: DATE OF DISCHARGE:                                CONSULTATION   REQUESTING PHYSICIAN:  Naima A. Dillard, MD  REASON FOR INFECTIOUS DISEASE CONSULTATION:  The patient with fever, sore throat, and neck pain who is [redacted] weeks pregnant.  HISTORY OF PRESENT ILLNESS:  Connie [name] is a 30 year old African American female with a past medical history significant for tonsillectomy, adenectomy who is in her third pregnancy [redacted] weeks pregnant who began to develop severe sore throat on Monday that has been followed by worsening neck pain, odynophagia, high fevers to 103, stiff neck and a headache. She was admitted last night through the emergency department her at [facility] and was febrile in the emergency department at 102.9. She has been admitted for observation overnight and been treated with Tylenol and Percocet.  She had an urinalysis , it was negative.  She continues to have severe headaches, drenching sweats and subjective fevers.  She has had nausea without emesis.  She has a sore throat and continues to have pain with swallowing liquids and solids.  Her group A strep, rapid strep screen has been positive on throat screen.  PAST MEDICAL HISTORY:  History of hypertension and asthma.  PAST SURGICAL HISTORY:  History of adenoidectomy, tonsillectomy and cholecystectomy.  FAMILY HISTORY:  Noncontributory.  SOCIAL HISTORY:  The patient is a former smoker, currently is not smoking while pregnant.  She has a boyfriend, lives in a house with two other children, boyfriend as well.  ALLERGIES:  Allergic to DILAUDID, which has caused her throat to swell up.  CURRENT MEDICATIONS:  In-house include Percocet, Sucrets lozenges, Motrin, lactated Ringer's and  Tylenol.  PHYSICAL EXAMINATION:  VITAL SIGNS:  The patient is currently afebrile with stable vital signs, normotensive, slightly tachycardic. GENERAL:  She is obviously uncomfortable.  She is alert and oriented person, place and location. HEENT:  She is normocephalic and atraumatic.  She is diffusely diaphoretic.  Her pupils are equal, round, and reactive to light.  Her sclerae anicteric.  Conjunctivae are clear. She has a Mallampati class IV airway.  Her oropharynx is quite difficult to visualize.  When I was able to suppress her tongue with a tongue depressor, she has erythema of the uvula and surrounding tissue, but no actual exudate visible.  NECK:  Has some cervical lymphadenopathy with freely movable lymph nodes.  Neck is tender to palpation of lymph nodes and at the neck muscles and cells, both anteriorly and posteriorly.  She has pain with neck flexion and extension and questionable meningismus with neck flexion producing worsening neck pain and possibly worsening headaches her headache is around entire circumference of her head.CARDIOVASCULAR:  Regular rate and rhythm.  No murmurs, gallops or rubs. LUNGS:  Clear to auscultation bilaterally. ABDOMEN:  Soft, nondistended, nontender.  Positive bowel sounds. EXTREMITIES:  Without edema. NEUROLOGIC:  Nonfocal, intact strength, sensation and no paresthesias. No obvious neurological deficits.  LABORATORY DATA:  The computer is currently down but the labs, which I saw in each chart earlier showed her to have a white count of 12,000, 4200, hemoglobin 9.6 and platelets 218 with ANC of 7.8.  Her urinalysis was negative.  No other radiographic.  Other laboratory data have done at this time.  IMPRESSION AND /RECOMMENDATIONS:  30 year old African American lady with history of sore throat, odynophagia, fevers, headaches, stiff neck: 1. Fevers with:  Differential would include group A strep pharyngitis     although she does lack an  exudative pharyngitis.  She could have     group strep group A pharyngitis.  She could have other     complications related to group A strep or other oropharyngeal     infections such as a abscess for identification is quite     prominences as is her neck tenderness and stiffness.  Meningitis is     also certainly possible, this is only one way to evaluate this and     I do spinal tap.  I have concerned the patient for lumbar puncture     at the bedside.  Unfortunately, there is no trace available to do     lumbar puncture currently [facility].  Therefore, I will     endeavor to obtain a lumbar puncture from the other hospitals     returned to try and do lumbar puncture.  If I am unsuccessful, I     will have to consult Radiology after hours to obtain a lumbar     puncture under IR guidance.  An opening pressure should be     obtained, cell count differential, protein and glucose as well as     cultures.  If this is a bacterial meningitis, Listeria would remain     the differential due to the patient being pregnant, pneumococcus     possible as well although she seems, I would fluctuate or     deteriorate more in the last 24 hours without antibiotics.     Meningococci was seen less likely given her demographics.  Other     possibility will be an aseptic meningitis.  Thank you very for this infectious disease consultation.  I will return and try to attempt a lumbar puncture if equipment can be obtained as Radiology is ready to do a spinal tap if I am not able to get one.  I spent over 60 minutes with this patient including greater than 50% of time in counselling the patient and in coordination of care.     Acey Lav, MD     CV/MEDQ  D:  11/24/2010  T:  11/24/2010  Job:  [id number]  Electronically Signed by Paulette Blanch DAM MD on 12/22/2010 05:48:20 PM

## 2011-02-21 LAB — CBC
HCT: 34.6 — ABNORMAL LOW
MCV: 86.6
Platelets: 234
RBC: 3.99
WBC: 8.2

## 2011-02-21 LAB — URINALYSIS, ROUTINE W REFLEX MICROSCOPIC
Bilirubin Urine: NEGATIVE
Hgb urine dipstick: NEGATIVE
Ketones, ur: NEGATIVE
Specific Gravity, Urine: 1.02
Urobilinogen, UA: 0.2

## 2011-02-21 LAB — WET PREP, GENITAL
Clue Cells Wet Prep HPF POC: NONE SEEN
Trich, Wet Prep: NONE SEEN

## 2011-02-21 LAB — BASIC METABOLIC PANEL
BUN: 10
Chloride: 103
Potassium: 4

## 2011-02-21 LAB — POCT PREGNANCY, URINE: Preg Test, Ur: NEGATIVE

## 2011-02-28 LAB — DIFFERENTIAL
Eosinophils Relative: 0
Monocytes Absolute: 0.6
Monocytes Relative: 10
Neutrophils Relative %: 72

## 2011-02-28 LAB — URINALYSIS, ROUTINE W REFLEX MICROSCOPIC
Nitrite: NEGATIVE
Protein, ur: NEGATIVE
Urobilinogen, UA: 0.2

## 2011-02-28 LAB — CBC
Platelets: 231
WBC: 5.8

## 2011-03-01 LAB — URINALYSIS, ROUTINE W REFLEX MICROSCOPIC
Glucose, UA: NEGATIVE
Nitrite: NEGATIVE
Protein, ur: NEGATIVE
pH: 6.5

## 2011-03-06 ENCOUNTER — Inpatient Hospital Stay (HOSPITAL_COMMUNITY)
Admission: AD | Admit: 2011-03-06 | Discharge: 2011-03-07 | Disposition: A | Discharge status: Home / Self Care | Payer: Medicaid Other | Source: Ambulatory Visit | Attending: Obstetrics and Gynecology | Admitting: Obstetrics and Gynecology

## 2011-03-06 ENCOUNTER — Encounter (HOSPITAL_COMMUNITY): Payer: Self-pay

## 2011-03-06 DIAGNOSIS — K644 Residual hemorrhoidal skin tags: Secondary | ICD-10-CM | POA: Insufficient documentation

## 2011-03-06 DIAGNOSIS — O10019 Pre-existing essential hypertension complicating pregnancy, unspecified trimester: Secondary | ICD-10-CM | POA: Insufficient documentation

## 2011-03-06 DIAGNOSIS — O228X9 Other venous complications in pregnancy, unspecified trimester: Secondary | ICD-10-CM | POA: Insufficient documentation

## 2011-03-06 HISTORY — DX: Headache: R51

## 2011-03-06 HISTORY — DX: Essential (primary) hypertension: I10

## 2011-03-06 HISTORY — DX: Gastro-esophageal reflux disease without esophagitis: K21.9

## 2011-03-06 LAB — CBC
HCT: 26.7 % — ABNORMAL LOW (ref 36.0–46.0)
RDW: 14.9 % (ref 11.5–15.5)
WBC: 8.1 10*3/uL (ref 4.0–10.5)

## 2011-03-06 LAB — COMPREHENSIVE METABOLIC PANEL
ALT: 10 U/L (ref 0–35)
AST: 14 U/L (ref 0–37)
Albumin: 2.7 g/dL — ABNORMAL LOW (ref 3.5–5.2)
Alkaline Phosphatase: 80 U/L (ref 39–117)
BUN: 7 mg/dL (ref 6–23)
Chloride: 99 mEq/L (ref 96–112)
Potassium: 3.2 mEq/L — ABNORMAL LOW (ref 3.5–5.1)
Sodium: 133 mEq/L — ABNORMAL LOW (ref 135–145)
Total Bilirubin: 0.2 mg/dL — ABNORMAL LOW (ref 0.3–1.2)

## 2011-03-06 LAB — LACTATE DEHYDROGENASE: LDH: 133 U/L (ref 94–250)

## 2011-03-06 MED ORDER — MORPHINE SULFATE 10 MG/ML IJ SOLN
8.0001 mg | Freq: Once | INTRAMUSCULAR | Status: AC
  Administered 2011-03-06: 8.0001 mg via INTRAMUSCULAR
  Filled 2011-03-06: qty 1

## 2011-03-06 MED ORDER — HYDROCORTISONE ACE-PRAMOXINE 1-1 % RE FOAM
1.0000 | Freq: Four times a day (QID) | RECTAL | Status: DC
  Administered 2011-03-06: 1 via RECTAL
  Filled 2011-03-06: qty 10

## 2011-03-06 MED ORDER — PROMETHAZINE HCL 25 MG/ML IJ SOLN
25.0000 mg | Freq: Once | INTRAMUSCULAR | Status: AC
  Administered 2011-03-06: 25 mg via INTRAMUSCULAR
  Filled 2011-03-06: qty 1

## 2011-03-06 MED ORDER — OXYCODONE-ACETAMINOPHEN 5-325 MG PO TABS
2.0000 | ORAL_TABLET | Freq: Once | ORAL | Status: AC
  Administered 2011-03-06: 2 via ORAL
  Filled 2011-03-06: qty 2

## 2011-03-06 MED ORDER — ONDANSETRON 8 MG PO TBDP: 8.0000 mg | ORAL_TABLET | Freq: Once | ORAL | Status: DC

## 2011-03-06 MED ORDER — HYDROCORTISONE ACE-PRAMOXINE 1-1 % RE CREA
1.0000 "application " | TOPICAL_CREAM | Freq: Four times a day (QID) | RECTAL | Status: DC
  Filled 2011-03-06: qty 30

## 2011-03-06 MED ORDER — MORPHINE SULFATE 4 MG/ML IJ SOLN
8.0000 mg | Freq: Once | INTRAMUSCULAR | Status: DC
  Filled 2011-03-06: qty 2

## 2011-03-06 NOTE — Progress Notes (Signed)
Pt reports hemoroids that have been worse this last week and uc's q 8-9 min

## 2011-03-06 NOTE — ED Provider Notes (Signed)
History     Chief Complaint  Patient presents with  . Hemorrhoids  . Contractions   HPI Comments: Pt is a 30yo [id number] at [redacted]w[redacted]d arriving to [facility] announced c/o severe hemorrhoid pain and ctx, states hemorrhoids have been about a week, has tried OTC cream and warm soaks without relief. States ctx have started today about 7-10 minutes apart, denies VB, LOF reports +FM.  Denies HA/N/V  Pregnancy significant for: 1. CHTN - not currently medicated 2. +GBS 3. Obesity 4. Smoking 5. asthma     Pt initially declined any allergies other than ABX, when specifically asked if has taken morphine or dilaudid, pt then reports throat swelling with dilaudid, and severe stomach upset with vicodin. Denies any previous reaction to morphine or codeine.    Past Medical History  Diagnosis Date  . Hypertension   . Asthma   . GERD (gastroesophageal reflux disease)   . Headache     Past Surgical History  Procedure Date  . Tonsillectomy   . Cholecystectomy, laparoscopic   . Breast cyst excision   . Adenoidectomy     No family history on file.  History  Substance Use Topics  . Smoking status: Current Some Day Smoker -- 0.2 packs/day for 20 years    Types: Cigarettes  . Smokeless tobacco: Not on file  . Alcohol Use: No    Allergies:  Allergies  Allergen Reactions  . Dilaudid (Hydromorphone Hcl) Anaphylaxis and Hives    Throat swelling  . Amoxicillin Swelling    Prescriptions prior to admission  Medication Sig Dispense Refill  . albuterol (PROVENTIL HFA;VENTOLIN HFA) 108 (90 BASE) MCG/ACT inhaler Inhale 2 puffs into the lungs every 6 (six) hours as needed. Shortness of breath       . prenatal vitamin w/FE, FA (PRENATAL 1 + 1) 27-1 MG TABS Take 1 tablet by mouth daily.          Review of Systems  Eyes: Negative for blurred vision.  Gastrointestinal: Negative for diarrhea and constipation.  Neurological: Negative for dizziness and headaches.  All other systems reviewed and are  negative.   Physical Exam   Blood pressure 149/96, pulse 83, temperature 98.4 F (36.9 C), temperature source Oral, resp. rate 18, height 5' 9.5" (1.765 m), weight 119.387 kg (263 lb 3.2 oz).  Physical Exam  Constitutional: She is oriented to person, place, and time. She appears well-developed and well-nourished. She appears distressed.       Pt tearful when visually examing hemorrhoids   Cardiovascular: Normal rate.   Respiratory: Effort normal.  GI: Soft.  Genitourinary: Vagina normal. Rectal exam shows external hemorrhoid.       VE=cl/th/high  Neurological: She is alert and oriented to person, place, and time.  Skin: Skin is warm and dry.  Psychiatric: She has a normal mood and affect. Her behavior is normal.   FHR 130 reactive CAT I toco irritability    MAU Course  Procedures  Assessment and Plan  IUP at 32wks Significant ext hemorrhoids, appear pink and not thrombosed  CHTN - unmediated FHR reassuring CAT I S/p percocet with no relief and significant nausea/vomiting  PIH labs nl  protocfoam Morphine and phenergan IM  Will D/C with phenergan and alternative PO pain medication Consult with gen surgery  D/W Dr Pennie Rushing    Malissa Hippo 03/06/2011, 11:12 PM   Pt feels relief after morphine and phenergan. D/W Dr. Pennie Rushing D/C home with RX for: proctofoam, phenergan and ultram. Office notified for Surg consult.  F/U as scheduled on Wednesday.

## 2011-03-06 NOTE — Progress Notes (Signed)
Onset of hemmorroids x 1 week has tried everything for relief not working x 1 week, contractions since last night.

## 2011-03-07 ENCOUNTER — Encounter: Payer: Self-pay | Admitting: Obstetrics and Gynecology

## 2011-03-07 MED ORDER — TRAMADOL HCL 50 MG PO TABS
50.0000 mg | ORAL_TABLET | Freq: Four times a day (QID) | ORAL | Status: AC | PRN
Start: 2011-03-07 — End: 2011-03-17

## 2011-03-07 MED ORDER — HYDROCORTISONE ACE-PRAMOXINE 1-1 % RE FOAM: 1.0000 | Freq: Four times a day (QID) | RECTAL | Status: AC

## 2011-03-07 MED ORDER — PROMETHAZINE HCL 50 MG PO TABS: 25.0000 mg | ORAL_TABLET | Freq: Four times a day (QID) | ORAL | Status: AC | PRN

## 2011-03-08 LAB — URINALYSIS, ROUTINE W REFLEX MICROSCOPIC
Bilirubin Urine: NEGATIVE
Ketones, ur: NEGATIVE
Nitrite: NEGATIVE
Protein, ur: NEGATIVE
Urobilinogen, UA: 0.2

## 2011-03-08 LAB — GC/CHLAMYDIA PROBE AMP, GENITAL: GC Probe Amp, Genital: NEGATIVE

## 2011-03-08 LAB — CBC
MCHC: 34.1
MCV: 85.6
Platelets: 278
RBC: 3.92

## 2011-03-08 LAB — WET PREP, GENITAL
Trich, Wet Prep: NONE SEEN
Yeast Wet Prep HPF POC: NONE SEEN

## 2011-03-08 LAB — POCT PREGNANCY, URINE: Operator id: 117411

## 2011-03-10 ENCOUNTER — Telehealth (HOSPITAL_COMMUNITY): Payer: Self-pay | Admitting: *Deleted

## 2011-03-14 ENCOUNTER — Encounter (HOSPITAL_COMMUNITY): Payer: Self-pay | Admitting: *Deleted

## 2011-03-14 ENCOUNTER — Telehealth (HOSPITAL_COMMUNITY): Payer: Self-pay | Admitting: *Deleted

## 2011-03-14 NOTE — Telephone Encounter (Signed)
Preadmission screen  

## 2011-03-15 ENCOUNTER — Telehealth (HOSPITAL_COMMUNITY): Payer: Self-pay | Admitting: *Deleted

## 2011-03-15 ENCOUNTER — Encounter (HOSPITAL_COMMUNITY): Payer: Self-pay | Admitting: *Deleted

## 2011-03-15 NOTE — Telephone Encounter (Signed)
Add GBS result 

## 2011-03-31 NOTE — H&P (Signed)
Connie [name], Connie [name]                  ACCOUNT NO.:  [id number]  MEDICAL RECORD NO.:  [id number]  LOCATION:                                 FACILITY:  PHYSICIAN:  Janine Limbo, M.D.DATE OF BIRTH:  11/22/1980  DATE OF ADMISSION:  04/01/2011 DATE OF DISCHARGE:                             HISTORY & PHYSICAL   HISTORY OF PRESENT ILLNESS:  Connie [name] is a 30 year old female, gravida 4, para 2-0-1-2, who presents at 39 weeks and 4 days gestation.  The patient has been followed at the [facility].  The patient's pregnancy has been complicated by a history of asthma.  She has done well during this pregnancy.  She also has a long history of cigarette smoking.  OBSTETRICAL HISTORY:  The patient had an elective pregnancy termination in 1996.  In 2004, the patient had a vaginal delivery at [redacted] weeks gestation.  In 2010, the patient had a vaginal delivery at [redacted] weeks gestation.  DRUG ALLERGIES:  The patient reports that she is allergic to Dilaudid. She also has seasonal allergies.  She reports that she is sensitive to latex.  PAST MEDICAL HISTORY:  The patient had a cyst removed from her left breast in the past.  She has a history of asthma, but has done well during this pregnancy.  She has a history of preeclampsia and a history of hypertension.  She was previously treated with hydrochlorothiazide. She has done well during this pregnancy, and has not required medication for her blood pressure.  SOCIAL HISTORY:  The patient smokes cigarettes on a regular basis.  She denies alcohol and other recreational drug uses.  PAST SURGICAL HISTORY:  The patient had a tonsillectomy and adenoidectomy as a child.  She had a gallbladder removed in 2007.  She had a lump removed from her left breast in 2008.  REVIEW OF SYSTEMS:  The patient complains of pelvic pressure symptoms.  FAMILY HISTORY:  Noncontributory.  PHYSICAL  EXAMINATION:  VITAL SIGNS:  Weight is 264 pounds.  Height is 5 feet 9 inches. HEENT:  Within normal limits. CHEST:  Clear. HEART:  Regular rate and rhythm. BREASTS:  Without masses. ABDOMEN:  Gravid with a fundal height of 38 cm. EXTREMITIES:  Grossly normal. NEUROLOGIC:  Grossly normal. PELVIC:  Cervix is 2 cm dilated, 50% effaced, and -2 in station.  LABORATORY VALUES:  Blood type is A positive.  Antibody screen negative. VDRL nonreactive.  Rubella is nonimmune.  HBsAg is negative.  HIV is nonreactive.  GC negative.  Chlamydia negative.  Third trimester beta strep is negative.  ASSESSMENT: 1. A 39-week and 4-day gestation. 2. History of preeclampsia versus chronic hypertension. 3. Cigarette smoker. 4. Asthma. 5. BMI equals 38. 6. Desires sterilization.  PLAN:  The patient will be admitted for induction of labor.  She will be given Cytotec followed by Pitocin the following morning.  Once she has adequate contractions, we will plan to rupture her membranes. The patient also desires sterilization, and we will plan a postpartum tubal ligation.     Janine Limbo, M.D.     AVS/MEDQ  D:  03/31/2011  T:  03/31/2011  Job:  [id number]

## 2011-04-01 ENCOUNTER — Encounter (HOSPITAL_COMMUNITY): Payer: Self-pay

## 2011-04-01 ENCOUNTER — Inpatient Hospital Stay (HOSPITAL_COMMUNITY)
Admission: RE | Admit: 2011-04-01 | Discharge: 2011-04-04 | DRG: 767 | Disposition: A | Discharge status: Home / Self Care | Payer: Medicaid Other | Source: Ambulatory Visit | Attending: Obstetrics and Gynecology | Admitting: Obstetrics and Gynecology

## 2011-04-01 DIAGNOSIS — Z9851 Tubal ligation status: Secondary | ICD-10-CM

## 2011-04-01 DIAGNOSIS — D649 Anemia, unspecified: Secondary | ICD-10-CM | POA: Diagnosis present

## 2011-04-01 DIAGNOSIS — Z302 Encounter for sterilization: Secondary | ICD-10-CM

## 2011-04-01 DIAGNOSIS — O10919 Unspecified pre-existing hypertension complicating pregnancy, unspecified trimester: Secondary | ICD-10-CM

## 2011-04-01 DIAGNOSIS — O9902 Anemia complicating childbirth: Secondary | ICD-10-CM | POA: Diagnosis present

## 2011-04-01 DIAGNOSIS — O1002 Pre-existing essential hypertension complicating childbirth: Principal | ICD-10-CM | POA: Diagnosis present

## 2011-04-01 DIAGNOSIS — Z3490 Encounter for supervision of normal pregnancy, unspecified, unspecified trimester: Secondary | ICD-10-CM

## 2011-04-01 LAB — CBC
MCH: 28.6 pg (ref 26.0–34.0)
MCHC: 33.2 g/dL (ref 30.0–36.0)
MCV: 86.1 fL (ref 78.0–100.0)
Platelets: 273 10*3/uL (ref 150–400)
RDW: 14.4 % (ref 11.5–15.5)

## 2011-04-01 LAB — COMPREHENSIVE METABOLIC PANEL
ALT: 9 U/L (ref 0–35)
AST: 16 U/L (ref 0–37)
Albumin: 3 g/dL — ABNORMAL LOW (ref 3.5–5.2)
Calcium: 9.5 mg/dL (ref 8.4–10.5)
Chloride: 100 mEq/L (ref 96–112)
Creatinine, Ser: 0.51 mg/dL (ref 0.50–1.10)
Sodium: 133 mEq/L — ABNORMAL LOW (ref 135–145)

## 2011-04-01 LAB — URIC ACID: Uric Acid, Serum: 2.8 mg/dL (ref 2.4–7.0)

## 2011-04-01 MED ORDER — MISOPROSTOL 25 MCG QUARTER TABLET
25.0000 ug | ORAL_TABLET | ORAL | Status: DC
  Administered 2011-04-01 – 2011-04-02 (×2): 25 ug via VAGINAL
  Filled 2011-04-01 (×3): qty 0.25

## 2011-04-01 MED ORDER — LACTATED RINGERS IV SOLN
INTRAVENOUS | Status: DC
  Administered 2011-04-01 – 2011-04-02 (×3): via INTRAVENOUS

## 2011-04-01 MED ORDER — ZOLPIDEM TARTRATE 10 MG PO TABS
10.0000 mg | ORAL_TABLET | Freq: Every evening | ORAL | Status: DC | PRN
  Administered 2011-04-01: 10 mg via ORAL
  Filled 2011-04-01: qty 1

## 2011-04-01 MED ORDER — LIDOCAINE HCL (PF) 1 % IJ SOLN: 30.0000 mL | INTRAMUSCULAR | Status: DC | PRN

## 2011-04-01 MED ORDER — TERBUTALINE SULFATE 1 MG/ML IJ SOLN: 0.2500 mg | Freq: Once | INTRAMUSCULAR | Status: AC | PRN

## 2011-04-01 MED ORDER — CITRIC ACID-SODIUM CITRATE 334-500 MG/5ML PO SOLN
30.0000 mL | ORAL | Status: DC | PRN
  Administered 2011-04-02: 30 mL via ORAL
  Filled 2011-04-01 (×2): qty 15

## 2011-04-01 MED ORDER — OXYCODONE-ACETAMINOPHEN 5-325 MG PO TABS: 2.0000 | ORAL_TABLET | ORAL | Status: DC | PRN

## 2011-04-01 MED ORDER — ONDANSETRON HCL 4 MG/2ML IJ SOLN: 4.0000 mg | Freq: Four times a day (QID) | INTRAMUSCULAR | Status: DC | PRN

## 2011-04-01 MED ORDER — FLEET ENEMA 7-19 GM/118ML RE ENEM
1.0000 | ENEMA | RECTAL | Status: DC | PRN
  Administered 2011-04-02: 1 via RECTAL

## 2011-04-01 MED ORDER — OXYTOCIN 20 UNITS IN LACTATED RINGERS INFUSION - SIMPLE
1.0000 m[IU]/min | INTRAVENOUS | Status: DC
  Administered 2011-04-02: 2 m[IU]/min via INTRAVENOUS

## 2011-04-01 MED ORDER — IBUPROFEN 600 MG PO TABS: 600.0000 mg | ORAL_TABLET | Freq: Four times a day (QID) | ORAL | Status: DC | PRN

## 2011-04-01 MED ORDER — NALBUPHINE SYRINGE 5 MG/0.5 ML
5.0000 mg | INJECTION | INTRAMUSCULAR | Status: DC | PRN
  Administered 2011-04-02: 5 mg via INTRAVENOUS
  Filled 2011-04-01: qty 0.5

## 2011-04-01 MED ORDER — OXYTOCIN 20 UNITS IN LACTATED RINGERS INFUSION - SIMPLE
125.0000 mL/h | Freq: Once | INTRAVENOUS | Status: DC
  Filled 2011-04-01: qty 1000

## 2011-04-01 MED ORDER — OXYTOCIN BOLUS FROM INFUSION
500.0000 mL | Freq: Once | INTRAVENOUS | Status: DC
  Filled 2011-04-01: qty 500

## 2011-04-01 MED ORDER — LACTATED RINGERS IV SOLN
500.0000 mL | INTRAVENOUS | Status: DC | PRN
  Administered 2011-04-02: 500 mL via INTRAVENOUS

## 2011-04-01 MED ORDER — ACETAMINOPHEN 325 MG PO TABS: 650.0000 mg | ORAL_TABLET | ORAL | Status: DC | PRN

## 2011-04-01 NOTE — Progress Notes (Addendum)
  Subjective: Now here on [facility], admitted for induction of labor due to chronic hypertension at term. Denies HA, visual symptoms, epigastric pain.  Objective: BP 149/92  Pulse 86  Temp(Src) 98.7 F (37.1 C) (Oral)  Resp 18  Ht 5' 9.5" (1.765 m)  Wt 119.296 kg (263 lb)  BMI 38.28 kg/m2      FHT:  FHR: 140 bpm, variability: moderate,  accelerations:  Present,  decelerations:  Absent UC:   irregular, every 4-6 minutes SVE:    2 cm, 50%, vtx -1 by Dr. Stefano Gaul this week Will update exam with placement of Cytotech.  Labs: Lab Results  Component Value Date   WBC 8.1 03/06/2011   HGB 8.8* 03/06/2011   HCT 26.7* 03/06/2011   MCV 87.5 03/06/2011   PLT 255 03/06/2011    Assessment / Plan: IUP at 39 4/7 weeks Chronic hypertension, no meds GBS negative Hx of Asthma Desires BTL--consent signed 01/04/11 Latex sensitive Chronic anemia  Per Dr. Stefano Gaul, plan Cytotec tonight, then pitocin in am, AROM as labor advances. Patient plans epidural. Will check PIH labs with admit labs. RN to notify MD for BP systolic >/= 160, diastolic >/= 105.  Nigel Bridgeman 04/01/2011, 8:33 PM

## 2011-04-02 ENCOUNTER — Encounter (HOSPITAL_COMMUNITY): Payer: Self-pay | Admitting: Anesthesiology

## 2011-04-02 ENCOUNTER — Inpatient Hospital Stay (HOSPITAL_COMMUNITY): Payer: Medicaid Other | Admitting: Anesthesiology

## 2011-04-02 ENCOUNTER — Encounter (HOSPITAL_COMMUNITY): Payer: Self-pay

## 2011-04-02 ENCOUNTER — Other Ambulatory Visit: Payer: Self-pay | Admitting: Obstetrics and Gynecology

## 2011-04-02 ENCOUNTER — Encounter (HOSPITAL_COMMUNITY)
Admission: RE | Disposition: A | Discharge status: Home / Self Care | Payer: Self-pay | Source: Ambulatory Visit | Attending: Obstetrics and Gynecology

## 2011-04-02 HISTORY — PX: TUBAL LIGATION: SHX77

## 2011-04-02 LAB — URINALYSIS, ROUTINE W REFLEX MICROSCOPIC
Leukocytes, UA: NEGATIVE
Nitrite: NEGATIVE
Specific Gravity, Urine: 1.025 (ref 1.005–1.030)
pH: 6.5 (ref 5.0–8.0)

## 2011-04-02 LAB — CBC
HCT: 26.3 % — ABNORMAL LOW (ref 36.0–46.0)
Hemoglobin: 9.1 g/dL — ABNORMAL LOW (ref 12.0–15.0)
Platelets: 202 10*3/uL (ref 150–400)
RBC: 3.14 MIL/uL — ABNORMAL LOW (ref 3.87–5.11)
RDW: 14.8 % (ref 11.5–15.5)
WBC: 7.5 10*3/uL (ref 4.0–10.5)
WBC: 11.4 10*3/uL — ABNORMAL HIGH (ref 4.0–10.5)

## 2011-04-02 LAB — RPR: RPR Ser Ql: NONREACTIVE

## 2011-04-02 SURGERY — LIGATION, FALLOPIAN TUBE, POSTPARTUM
Anesthesia: Epidural | Site: Abdomen | Laterality: Bilateral | Wound class: Clean Contaminated

## 2011-04-02 MED ORDER — CITRIC ACID-SODIUM CITRATE 334-500 MG/5ML PO SOLN
30.0000 mL | Freq: Once | ORAL | Status: AC
  Administered 2011-04-02: 30 mL via ORAL

## 2011-04-02 MED ORDER — SENNOSIDES-DOCUSATE SODIUM 8.6-50 MG PO TABS: 2.0000 | ORAL_TABLET | Freq: Every day | ORAL | Status: DC

## 2011-04-02 MED ORDER — FENTANYL 2.5 MCG/ML BUPIVACAINE 1/10 % EPIDURAL INFUSION (WH - ANES)
14.0000 mL/h | INTRAMUSCULAR | Status: DC
  Administered 2011-04-02: 12 mL/h via EPIDURAL
  Filled 2011-04-02: qty 60

## 2011-04-02 MED ORDER — LABETALOL HCL 5 MG/ML IV SOLN
20.0000 mg | INTRAVENOUS | Status: AC | PRN
  Administered 2011-04-02: 40 mg via INTRAVENOUS
  Administered 2011-04-02: 20 mg via INTRAVENOUS
  Filled 2011-04-02: qty 8

## 2011-04-02 MED ORDER — LABETALOL HCL 5 MG/ML IV SOLN
INTRAVENOUS | Status: AC
  Filled 2011-04-02: qty 4

## 2011-04-02 MED ORDER — NIFEDIPINE ER 30 MG PO TB24
30.0000 mg | ORAL_TABLET | Freq: Every day | ORAL | Status: DC
  Filled 2011-04-02: qty 1

## 2011-04-02 MED ORDER — FENTANYL CITRATE 0.05 MG/ML IJ SOLN
INTRAMUSCULAR | Status: DC | PRN
  Administered 2011-04-02: 100 ug via INTRAVENOUS

## 2011-04-02 MED ORDER — LABETALOL HCL 5 MG/ML IV SOLN
INTRAVENOUS | Status: DC | PRN
  Administered 2011-04-02 (×2): 10 mg via INTRAVENOUS

## 2011-04-02 MED ORDER — KETOROLAC TROMETHAMINE 30 MG/ML IJ SOLN
15.0000 mg | Freq: Once | INTRAMUSCULAR | Status: AC | PRN
  Administered 2011-04-02: 30 mg via INTRAVENOUS

## 2011-04-02 MED ORDER — BENZOCAINE-MENTHOL 20-0.5 % EX AERO: 1.0000 "application " | INHALATION_SPRAY | CUTANEOUS | Status: DC | PRN

## 2011-04-02 MED ORDER — LACTATED RINGERS IV SOLN
500.0000 mL | Freq: Once | INTRAVENOUS | Status: AC
  Administered 2011-04-02: 500 mL via INTRAVENOUS

## 2011-04-02 MED ORDER — LABETALOL HCL 5 MG/ML IV SOLN
INTRAVENOUS | Status: AC
  Administered 2011-04-02: 40 mg via INTRAVENOUS
  Filled 2011-04-02: qty 4

## 2011-04-02 MED ORDER — IBUPROFEN 600 MG PO TABS
600.0000 mg | ORAL_TABLET | Freq: Four times a day (QID) | ORAL | Status: DC
  Administered 2011-04-02 – 2011-04-04 (×7): 600 mg via ORAL
  Filled 2011-04-02 (×7): qty 1

## 2011-04-02 MED ORDER — LABETALOL HCL 5 MG/ML IV SOLN
20.0000 mg | Freq: Once | INTRAVENOUS | Status: DC
  Filled 2011-04-02: qty 4

## 2011-04-02 MED ORDER — LABETALOL HCL 5 MG/ML IV SOLN
INTRAVENOUS | Status: AC
  Administered 2011-04-02: 20 mg via INTRAVENOUS
  Filled 2011-04-02: qty 4

## 2011-04-02 MED ORDER — WITCH HAZEL-GLYCERIN EX PADS: 1.0000 "application " | MEDICATED_PAD | CUTANEOUS | Status: DC | PRN

## 2011-04-02 MED ORDER — FENTANYL CITRATE 0.05 MG/ML IJ SOLN
INTRAMUSCULAR | Status: AC
  Administered 2011-04-02: 50 ug via INTRAVENOUS
  Filled 2011-04-02: qty 2

## 2011-04-02 MED ORDER — LANOLIN HYDROUS EX OINT: TOPICAL_OINTMENT | CUTANEOUS | Status: DC | PRN

## 2011-04-02 MED ORDER — SODIUM CHLORIDE 0.9 % IR SOLN
Status: DC | PRN
  Administered 2011-04-02: 1000 mL

## 2011-04-02 MED ORDER — FAMOTIDINE IN NACL 20-0.9 MG/50ML-% IV SOLN
20.0000 mg | Freq: Once | INTRAVENOUS | Status: AC
  Administered 2011-04-02: 20 mg via INTRAVENOUS
  Filled 2011-04-02: qty 50

## 2011-04-02 MED ORDER — TETANUS-DIPHTH-ACELL PERTUSSIS 5-2.5-18.5 LF-MCG/0.5 IM SUSP: 0.5000 mL | Freq: Once | INTRAMUSCULAR | Status: DC

## 2011-04-02 MED ORDER — DIPHENHYDRAMINE HCL 50 MG/ML IJ SOLN: 12.5000 mg | INTRAMUSCULAR | Status: DC | PRN

## 2011-04-02 MED ORDER — ACETAMINOPHEN 325 MG PO TABS: 325.0000 mg | ORAL_TABLET | ORAL | Status: DC | PRN

## 2011-04-02 MED ORDER — LABETALOL HCL 5 MG/ML IV SOLN
10.0000 mg | Freq: Once | INTRAVENOUS | Status: AC
  Administered 2011-04-02: 10 mg via INTRAVENOUS
  Filled 2011-04-02: qty 4

## 2011-04-02 MED ORDER — ALBUTEROL SULFATE HFA 108 (90 BASE) MCG/ACT IN AERS
2.0000 | INHALATION_SPRAY | Freq: Four times a day (QID) | RESPIRATORY_TRACT | Status: DC | PRN
  Filled 2011-04-02: qty 6.7

## 2011-04-02 MED ORDER — LACTATED RINGERS IV SOLN
INTRAVENOUS | Status: DC | PRN
  Administered 2011-04-02 (×2): via INTRAVENOUS

## 2011-04-02 MED ORDER — PHENYLEPHRINE 40 MCG/ML (10ML) SYRINGE FOR IV PUSH (FOR BLOOD PRESSURE SUPPORT): 80.0000 ug | PREFILLED_SYRINGE | INTRAVENOUS | Status: DC | PRN

## 2011-04-02 MED ORDER — PHENYLEPHRINE 40 MCG/ML (10ML) SYRINGE FOR IV PUSH (FOR BLOOD PRESSURE SUPPORT)
80.0000 ug | PREFILLED_SYRINGE | INTRAVENOUS | Status: DC | PRN
  Filled 2011-04-02: qty 5

## 2011-04-02 MED ORDER — LABETALOL HCL 5 MG/ML IV SOLN
INTRAVENOUS | Status: AC
  Administered 2011-04-02: 10 mg via INTRAVENOUS
  Filled 2011-04-02: qty 4

## 2011-04-02 MED ORDER — FENTANYL CITRATE 0.05 MG/ML IJ SOLN
INTRAMUSCULAR | Status: AC
  Filled 2011-04-02: qty 2

## 2011-04-02 MED ORDER — FENTANYL CITRATE 0.05 MG/ML IJ SOLN
25.0000 ug | INTRAMUSCULAR | Status: DC | PRN
  Administered 2011-04-02: 100 ug via INTRAVENOUS
  Administered 2011-04-02: 50 ug via INTRAVENOUS
  Administered 2011-04-02 (×2): 100 ug via INTRAVENOUS

## 2011-04-02 MED ORDER — MIDAZOLAM HCL 5 MG/ML IJ SOLN
INTRAMUSCULAR | Status: DC | PRN
  Administered 2011-04-02: 2 mg via INTRAVENOUS

## 2011-04-02 MED ORDER — METOCLOPRAMIDE HCL 5 MG/ML IJ SOLN
10.0000 mg | Freq: Once | INTRAMUSCULAR | Status: AC
  Administered 2011-04-02: 10 mg via INTRAVENOUS
  Filled 2011-04-02: qty 2

## 2011-04-02 MED ORDER — MEDROXYPROGESTERONE ACETATE 150 MG/ML IM SUSP: 150.0000 mg | INTRAMUSCULAR | Status: DC | PRN

## 2011-04-02 MED ORDER — KETOROLAC TROMETHAMINE 30 MG/ML IJ SOLN
INTRAMUSCULAR | Status: AC
  Filled 2011-04-02: qty 1

## 2011-04-02 MED ORDER — MEASLES, MUMPS & RUBELLA VAC ~~LOC~~ INJ
0.5000 mL | INJECTION | Freq: Once | SUBCUTANEOUS | Status: AC
  Administered 2011-04-04: 0.5 mL via SUBCUTANEOUS
  Filled 2011-04-02: qty 0.5

## 2011-04-02 MED ORDER — EPHEDRINE 5 MG/ML INJ
10.0000 mg | INTRAVENOUS | Status: DC | PRN
  Filled 2011-04-02: qty 4

## 2011-04-02 MED ORDER — LIDOCAINE HCL 1.5 % IJ SOLN
INTRAMUSCULAR | Status: DC | PRN
  Administered 2011-04-02 (×2): 5 mL via EPIDURAL

## 2011-04-02 MED ORDER — PROMETHAZINE HCL 25 MG/ML IJ SOLN: 6.2500 mg | INTRAMUSCULAR | Status: DC | PRN

## 2011-04-02 MED ORDER — EPHEDRINE 5 MG/ML INJ: 10.0000 mg | INTRAVENOUS | Status: DC | PRN

## 2011-04-02 MED ORDER — LABETALOL HCL 5 MG/ML IV SOLN
10.0000 mg | INTRAVENOUS | Status: DC | PRN
  Administered 2011-04-02 (×2): 10 mg via INTRAVENOUS
  Filled 2011-04-02: qty 8

## 2011-04-02 MED ORDER — OXYCODONE-ACETAMINOPHEN 5-325 MG PO TABS
1.0000 | ORAL_TABLET | ORAL | Status: DC | PRN
  Administered 2011-04-03: 1 via ORAL
  Administered 2011-04-03: 2 via ORAL
  Administered 2011-04-03: 1 via ORAL
  Administered 2011-04-03: 2 via ORAL
  Administered 2011-04-04 (×3): 1 via ORAL
  Filled 2011-04-02 (×4): qty 1
  Filled 2011-04-02 (×3): qty 2

## 2011-04-02 MED ORDER — DIBUCAINE 1 % RE OINT: 1.0000 "application " | TOPICAL_OINTMENT | RECTAL | Status: DC | PRN

## 2011-04-02 MED ORDER — MIDAZOLAM HCL 2 MG/2ML IJ SOLN
INTRAMUSCULAR | Status: AC
  Filled 2011-04-02: qty 2

## 2011-04-02 MED ORDER — NIFEDIPINE ER 30 MG PO TB24
30.0000 mg | ORAL_TABLET | Freq: Once | ORAL | Status: AC
  Administered 2011-04-02: 30 mg via ORAL
  Filled 2011-04-02: qty 1

## 2011-04-02 MED ORDER — LABETALOL HCL 5 MG/ML IV SOLN: 10.0000 mg/min | INTRAVENOUS | Status: DC | PRN

## 2011-04-02 MED ORDER — ONDANSETRON HCL 4 MG/2ML IJ SOLN
4.0000 mg | INTRAMUSCULAR | Status: DC | PRN
  Administered 2011-04-02: 4 mg via INTRAVENOUS
  Filled 2011-04-02: qty 2

## 2011-04-02 MED ORDER — ONDANSETRON HCL 4 MG PO TABS: 4.0000 mg | ORAL_TABLET | ORAL | Status: DC | PRN

## 2011-04-02 MED ORDER — BUPIVACAINE-EPINEPHRINE 0.5% -1:200000 IJ SOLN
INTRAMUSCULAR | Status: DC | PRN
  Administered 2011-04-02: 6 mL

## 2011-04-02 MED ORDER — SIMETHICONE 80 MG PO CHEW
80.0000 mg | CHEWABLE_TABLET | ORAL | Status: DC | PRN
  Administered 2011-04-03: 80 mg via ORAL

## 2011-04-02 MED ORDER — DIPHENHYDRAMINE HCL 25 MG PO CAPS: 25.0000 mg | ORAL_CAPSULE | Freq: Four times a day (QID) | ORAL | Status: DC | PRN

## 2011-04-02 MED ORDER — PRENATAL PLUS 27-1 MG PO TABS: 1.0000 | ORAL_TABLET | Freq: Every day | ORAL | Status: DC

## 2011-04-02 MED ORDER — ZOLPIDEM TARTRATE 5 MG PO TABS: 5.0000 mg | ORAL_TABLET | Freq: Every evening | ORAL | Status: DC | PRN

## 2011-04-02 MED ORDER — FENTANYL CITRATE 0.05 MG/ML IJ SOLN
INTRAMUSCULAR | Status: AC
  Administered 2011-04-02: 100 ug via INTRAVENOUS
  Filled 2011-04-02: qty 2

## 2011-04-02 MED ORDER — PRENATAL PLUS 27-1 MG PO TABS
1.0000 | ORAL_TABLET | Freq: Every day | ORAL | Status: DC
  Administered 2011-04-04: 1 via ORAL
  Filled 2011-04-02 (×2): qty 1

## 2011-04-02 MED ORDER — LIDOCAINE-EPINEPHRINE 2 %-1:100000 IJ SOLN
INTRAMUSCULAR | Status: DC | PRN
  Administered 2011-04-02 (×2): 5 mL via INTRADERMAL
  Administered 2011-04-02: 10 mL

## 2011-04-02 SURGICAL SUPPLY — 29 items
CHLORAPREP W/TINT 26ML (MISCELLANEOUS) ×2 IMPLANT
CLOTH BEACON ORANGE TIMEOUT ST (SAFETY) ×2 IMPLANT
CONTAINER PREFILL 10% NBF 15ML (MISCELLANEOUS) ×4 IMPLANT
DRAPE UTILITY XL STRL (DRAPES) ×2 IMPLANT
DRSG COVADERM PLUS 2X2 (GAUZE/BANDAGES/DRESSINGS) ×2 IMPLANT
ELECT REM PT RETURN 9FT ADLT (ELECTROSURGICAL)
ELECTRODE REM PT RTRN 9FT ADLT (ELECTROSURGICAL) IMPLANT
GLOVE BIOGEL PI IND STRL 8.5 (GLOVE) ×2 IMPLANT
GLOVE BIOGEL PI INDICATOR 8.5 (GLOVE) ×2
GLOVE ECLIPSE 8.0 STRL XLNG CF (GLOVE) IMPLANT
GLOVE SKINSENSE NS SZ6.5 (GLOVE) ×2
GLOVE SKINSENSE NS SZ8.0 LF (GLOVE) ×1
GLOVE SKINSENSE STRL SZ6.5 (GLOVE) ×2 IMPLANT
GLOVE SKINSENSE STRL SZ8.0 LF (GLOVE) ×1 IMPLANT
GOWN PREVENTION PLUS LG XLONG (DISPOSABLE) ×2 IMPLANT
GOWN PREVENTION PLUS XXLARGE (GOWN DISPOSABLE) ×2 IMPLANT
NEEDLE HYPO 25X1 1.5 SAFETY (NEEDLE) ×2 IMPLANT
NS IRRIG 1000ML POUR BTL (IV SOLUTION) ×2 IMPLANT
PACK ABDOMINAL MINOR (CUSTOM PROCEDURE TRAY) ×2 IMPLANT
PENCIL BUTTON HOLSTER BLD 10FT (ELECTRODE) IMPLANT
SPONGE LAP 4X18 X RAY DECT (DISPOSABLE) ×2 IMPLANT
SUT MNCRL AB 4-0 PS2 18 (SUTURE) ×2 IMPLANT
SUT PLAIN 0 NONE (SUTURE) ×2 IMPLANT
SUT VIC AB 2-0 CT1 27 (SUTURE) ×1
SUT VIC AB 2-0 CT1 TAPERPNT 27 (SUTURE) ×1 IMPLANT
SYR CONTROL 10ML LL (SYRINGE) ×2 IMPLANT
TOWEL OR 17X24 6PK STRL BLUE (TOWEL DISPOSABLE) ×4 IMPLANT
TRAY FOLEY CATH 14FR (SET/KITS/TRAYS/PACK) ×2 IMPLANT
WATER STERILE IRR 1000ML POUR (IV SOLUTION) ×2 IMPLANT

## 2011-04-02 NOTE — Progress Notes (Signed)
Back from bathroom 

## 2011-04-02 NOTE — Op Note (Signed)
Operative note  Date: 04/02/2011  Preoperative diagnosis:  Postpartum day 0  Desires sterilization  Obesity  Postoperative diagnosis:  Same  Procedure:  Modified Pomeroy postpartum tubal ligation  Surgeon:  Leonard Schwartz M.D.  Anesthesia:  Epidural  Disposition:  The patient is a 30 year old female who had a vaginal delivery of a healthy female infant today. She desires sterilization. She has an epidural catheter in place. She understands the indications for her procedure as well as her alternative treatment options. She accepts the risk of, but not limited to, anesthetic complications, bleeding, infections, possible damage to the surrounding organs, and possible tubal failure.  Findings:  The fallopian tubes were normal bilaterally.  Procedure:  The patient was taken to the operating room where it was determined that the epidural she received her labor would be adequate for tubal sterilization. Her abdomen was prepped with ChloraPrep. The patient was then sterilely draped. The subumbilical area was injected with half percent Marcaine with epinephrine. A subumbilical incision was made and carried sharply through the subcutaneous tissue, the fascia, and the anterior peritoneum. The left fallopian tube was identified and followed to fimbriated end. A knuckle of tube was made in the left using 2 free ties of 0 plain catgut suture. The knuckle of tube made was then excised. Hemostasis was adequate. An identical procedure was carried out on the opposite side. Again hemostasis was adequate. On sutures were removed. The fascia was closed using a running suture of 0 Vicryl. The skin was reapproximated using a subcuticular suture of 3-0 Monocryl. Sponge, needle, and isthmic counts were correct on 2 occasions. The estimated blood loss was less than 10 cc. The patient tolerated her procedure well. She was transported to the recovery room in stable condition. The cut portions of the  fallopian tubes were sent to pathology.  Leonard Schwartz MD

## 2011-04-02 NOTE — Progress Notes (Signed)
Restarting IV- Pt in pain demanding IV out at this time

## 2011-04-02 NOTE — Progress Notes (Signed)
Connie [name] is a 30 y.o. [phone] at [redacted]w[redacted]d admitted for induction of labor due to cigarette smoking and history of Hypertension. The patient denies headaches, blurred vision, and right upper quad pain.  Subjective:  Objective: BP 164/102  Pulse 81  Temp(Src) 98.6 F (37 C) (Oral)  Resp 18  Ht 5' 9.5" (1.765 m)  Wt 119.296 kg (263 lb)  BMI 38.28 kg/m2  Abd: Nontender Ext: trace edema Reflexes: II/IV    FHT:  Category 1 UC:   irregular, every 5 minutes SVE:   Dilation: 2.5 Effacement (%): 70 Station: -3 Exam by:: [employer] RN  Labs: Lab Results  Component Value Date   WBC 8.6 04/01/2011   HGB 9.5* 04/01/2011   HCT 28.6* 04/01/2011   MCV 86.1 04/01/2011   PLT 273 04/01/2011   Urine Protein: Negative  CMP: WNL  Assessment / Plan: PIH vs. Chronic Hypertension. No signs of Preeclampsia Not yet in active Labor  Labor: Not in labor Preeclampsia:  no signs or symptoms of toxicity Fetal Wellbeing:  Category I Pain Control:  Labor support without medications I/D:  n/a Anticipated MOD:  NSVD  Connie [name] 04/02/2011, 8:48 AM

## 2011-04-02 NOTE — Progress Notes (Signed)
Labetalol 20mg  IV slow push per MD orders

## 2011-04-02 NOTE — Progress Notes (Signed)
Updated Dr. Stefano Gaul on pt status including cervical exam, fetal strip, variables, vital signs, no new orders received.

## 2011-04-02 NOTE — Progress Notes (Signed)
Pt contracting and states pain as blood pressure resulted. Will recheck

## 2011-04-02 NOTE — Anesthesia Postprocedure Evaluation (Deleted)
Anesthesia Post Note  Patient: Connie [name]  Procedure(s) Performed:  POST PARTUM TUBAL LIGATION - Bilateral post partum tubal ligation  Anesthesia type: Epidural  Patient location: Mother/Baby  Post pain: Pain level controlled  Post assessment: Post-op Vital signs reviewed  Last Vitals:  Filed Vitals:   04/02/11 1730  BP:   Pulse:   Temp: 36.6 C  Resp: 16    Post vital signs: Reviewed  Level of consciousness: awake  Complications: No apparent anesthesia complications

## 2011-04-02 NOTE — Anesthesia Preprocedure Evaluation (Addendum)
Anesthesia Evaluation  Patient identified by MRN, date of birth, ID band Patient awake    Reviewed: Allergy & Precautions, H&P , Patient's Chart, lab work & pertinent test results  Airway Mallampati: III TM Distance: >3 FB Neck ROM: full    Dental No notable dental hx.    Pulmonary neg pulmonary ROS, asthma ,  clear to auscultation  Pulmonary exam normal       Cardiovascular hypertension, neg cardio ROS regular Normal    Neuro/Psych  Headaches, Negative Neurological ROS  Negative Psych ROS   GI/Hepatic negative GI ROS, Neg liver ROS, GERD-  ,  Endo/Other  Negative Endocrine ROSMorbid obesity  Renal/GU negative Renal ROS     Musculoskeletal   Abdominal   Peds  Hematology negative hematology ROS (+)   Anesthesia Other Findings   Reproductive/Obstetrics (+) Pregnancy                           Anesthesia Physical Anesthesia Plan  ASA: III  Anesthesia Plan: Epidural   Post-op Pain Management:    Induction:   Airway Management Planned:   Additional Equipment:   Intra-op Plan:   Post-operative Plan:   Informed Consent: I have reviewed the patients History and Physical, chart, labs and discussed the procedure including the risks, benefits and alternatives for the proposed anesthesia with the patient or authorized representative who has indicated his/her understanding and acceptance.     Plan Discussed with:   Anesthesia Plan Comments:         Anesthesia Quick Evaluation

## 2011-04-02 NOTE — Progress Notes (Addendum)
Connie [name] is a 30 y.o. [id number] at [redacted]w[redacted]d  Subjective:   Objective: BP 152/88  Pulse 79  Temp(Src) 98 F (36.7 C) (Oral)  Resp 16  Ht 5' 9.5" (1.765 m)  Wt 119.296 kg (263 lb)  BMI 38.28 kg/m2      FHT:  Category 1 UC:   regular, every 3 minutes SVE:   Dilation: 4 Effacement (%): 70 Station: -2 Exam by:: Dr. Stefano Gaul  Labs: Lab Results  Component Value Date   WBC 8.6 04/01/2011   HGB 9.5* 04/01/2011   HCT 28.6* 04/01/2011   MCV 86.1 04/01/2011   PLT 273 04/01/2011    Assessment / Plan: Augmentation of labor, progressing well  Labor: Progressing on Pitocin, will continue to increase then AROM Preeclampsia:  na Fetal Wellbeing:  Category I Pain Control:  Labor support without medications I/D:  n/a Anticipated MOD:  NSVD IUPC placed.  Sarabeth Benton V 04/02/2011, 11:34 AM

## 2011-04-02 NOTE — Anesthesia Postprocedure Evaluation (Signed)
Anesthesia Post Note  Patient: Connie [name]  Procedure(s) Performed:  POST PARTUM TUBAL LIGATION - Bilateral post partum tubal ligation  Anesthesia type: Epidural  Patient location: Mother/Baby  Post pain: Pain level controlled  Post assessment: Post-op Vital signs reviewed  Last Vitals:  Filed Vitals:   04/02/11 1900  BP: 168/82  Pulse: 73  Temp:   Resp: 16    Post vital signs: Reviewed  Level of consciousness: awake  Complications: No apparent anesthesia complications

## 2011-04-02 NOTE — OR Nursing (Signed)
Foley catheter in on arrival to OR. Urine color-yellow

## 2011-04-02 NOTE — Op Note (Signed)
Delivery Note At 2:40 PM a viable and healthy female was delivered via Vaginal, Spontaneous Delivery (Presentation: OA ;"Qhi'yon").  APGAR: 9, 9; weight 7 lb 2 oz (3232 g).   Placenta status: Intact, Spontaneous.  Cord: 3 vessels with the following complications: .  Anesthesia: Epidural  Episiotomy: None Lacerations: None Suture Repair: NA Est. Blood Loss (mL): 400  The patient desires a postpartum tubal sterilization procedure. We discussed the risk of her procedure which include, but are not limited to, anesthetic complications, bleeding, infections, possible damage to the surrounding organs, and possible tubal failure (17 per 1000). She will remain n.p.o. the procedure will be scheduled with the operating room team. The anesthesiologist requested that we have a second team standby in case an emergency cesarean section is needed.  Mom to OR.   Baby A to nursery-stable.   Baby B to NA.  Kirkland Hun V 04/02/2011, 3:13 PM

## 2011-04-02 NOTE — Progress Notes (Signed)
Pt to have stat CBC before epidrual per Dr. Sheral Apley

## 2011-04-02 NOTE — Transfer of Care (Signed)
Immediate Anesthesia Transfer of Care Note  Patient: Connie [name]  Procedure(s) Performed:  POST PARTUM TUBAL LIGATION - Bilateral post partum tubal ligation  Patient Location: PACU  Anesthesia Type: Epidural  Level of Consciousness: sedated  Airway & Oxygen Therapy: Patient Spontanous Breathing  Post-op Assessment: Report given to PACU RN and Post -op Vital signs reviewed and stable  Post vital signs: stable  Complications: No apparent anesthesia complications

## 2011-04-02 NOTE — Anesthesia Preprocedure Evaluation (Addendum)
Anesthesia Evaluation  Patient identified by MRN, date of birth, ID band Patient awake    Reviewed: Allergy & Precautions, H&P , Patient's Chart, lab work & pertinent test results  Airway Mallampati: III TM Distance: >3 FB Neck ROM: full    Dental No notable dental hx.    Pulmonary neg pulmonary ROS, asthma ,  clear to auscultation  Pulmonary exam normal       Cardiovascular hypertension, neg cardio ROS regular Normal    Neuro/Psych  Headaches, Negative Neurological ROS  Negative Psych ROS   GI/Hepatic negative GI ROS, Neg liver ROS, GERD-  ,  Endo/Other  Negative Endocrine ROSMorbid obesity  Renal/GU negative Renal ROS     Musculoskeletal   Abdominal   Peds  Hematology negative hematology ROS (+)   Anesthesia Other Findings   Reproductive/Obstetrics                           Anesthesia Physical Anesthesia Plan  ASA: III  Anesthesia Plan: Epidural   Post-op Pain Management:    Induction:   Airway Management Planned:   Additional Equipment:   Intra-op Plan:   Post-operative Plan:   Informed Consent: I have reviewed the patients History and Physical, chart, labs and discussed the procedure including the risks, benefits and alternatives for the proposed anesthesia with the patient or authorized representative who has indicated his/her understanding and acceptance.     Plan Discussed with:   Anesthesia Plan Comments:         Anesthesia Quick Evaluation

## 2011-04-02 NOTE — Anesthesia Procedure Notes (Signed)
Epidural Patient location during procedure: OB Start time: 04/02/2011 1:05 PM  Staffing Performed by: anesthesiologist   Preanesthetic Checklist Completed: patient identified, site marked, surgical consent, pre-op evaluation, timeout performed, IV checked, risks and benefits discussed and monitors and equipment checked  Epidural Patient position: sitting Prep: site prepped and draped and DuraPrep Patient monitoring: continuous pulse ox and blood pressure Approach: midline Injection technique: LOR air and LOR saline  Needle:  Needle type: Tuohy  Needle gauge: 17 G Needle length: 9 cm Needle insertion depth: 9 cm Catheter type: closed end flexible Catheter size: 19 Gauge Catheter at skin depth: 14 cm Test dose: negative  Assessment Events: blood not aspirated, injection not painful, no injection resistance, negative IV test and no paresthesia  Additional Notes Patient identified.  Risk benefits discussed including failed block, incomplete pain control, headache, nerve damage, paralysis, blood pressure changes, nausea, vomiting, reactions to medication both toxic or allergic, and postpartum back pain.  Patient expressed understanding and wished to proceed.  All questions were answered.  Sterile technique used throughout procedure and epidural site dressed with sterile barrier dressing. No paresthesia or other complications noted.The patient did not experience any signs of intravascular injection such as tinnitus or metallic taste in mouth nor signs of intrathecal spread such as rapid motor block. Please see nursing notes for vital signs.

## 2011-04-02 NOTE — Progress Notes (Signed)
Extra staff in to assist

## 2011-04-02 NOTE — Progress Notes (Signed)
Pt refuses to lay on side for BP. RN encourages pt it will help with BP.

## 2011-04-02 NOTE — Progress Notes (Signed)
Called Dr. Stefano Gaul and notified of recent SVE, BP readings and interventions used. MD aware. MD notified of 2 doses of cytotec given through night. MD orders to start Pitocin now

## 2011-04-02 NOTE — Progress Notes (Signed)
BP resulted during contraction

## 2011-04-03 DIAGNOSIS — Z9289 Personal history of other medical treatment: Secondary | ICD-10-CM

## 2011-04-03 HISTORY — DX: Personal history of other medical treatment: Z92.89

## 2011-04-03 LAB — CBC
Hemoglobin: 6.7 g/dL — CL (ref 12.0–15.0)
Platelets: 199 10*3/uL (ref 150–400)
RBC: 2.3 MIL/uL — ABNORMAL LOW (ref 3.87–5.11)
WBC: 11.3 10*3/uL — ABNORMAL HIGH (ref 4.0–10.5)

## 2011-04-03 LAB — PREPARE RBC (CROSSMATCH)

## 2011-04-03 MED ORDER — BENZOCAINE-MENTHOL 20-0.5 % EX AERO
INHALATION_SPRAY | CUTANEOUS | Status: AC
  Administered 2011-04-03: 01:00:00
  Filled 2011-04-03: qty 56

## 2011-04-03 MED ORDER — ALUM & MAG HYDROXIDE-SIMETH 200-200-25 MG PO CHEW: 2.0000 | CHEWABLE_TABLET | Freq: Three times a day (TID) | ORAL | Status: DC | PRN

## 2011-04-03 MED ORDER — GLYCERIN (LAXATIVE) 2.1 G RE SUPP
1.0000 | Freq: Every day | RECTAL | Status: DC | PRN
  Filled 2011-04-03: qty 1

## 2011-04-03 MED ORDER — ALUM & MAG HYDROXIDE-SIMETH 200-200-20 MG/5ML PO SUSP
30.0000 mL | Freq: Three times a day (TID) | ORAL | Status: DC | PRN
  Administered 2011-04-03: 30 mL via ORAL
  Filled 2011-04-03: qty 30

## 2011-04-03 NOTE — Progress Notes (Addendum)
Subjective:  The patient reports that she feels well. She is tolerating a regular diet and watching TV with her friends. She continues to complain of gas pain. She does not want to use the suppository.  Objective:  BP 145/92  Pulse 89  Temp(Src) 98.4 F (36.9 C) (Oral)  Resp 19  Ht 5' 9.5" (1.765 m)  Wt 119.296 kg (263 lb)  BMI 38.28 kg/m2  SpO2 99%  Breastfeeding? Unknown  HEENT: The patient is laughing in her room with her family and eating barbecue chicken.  Assessment:  Stable  Plan:  Medication for gas pain.  Complete transfusion.  CBC.  Discharge in a.m.  Connie [name].D.

## 2011-04-03 NOTE — Progress Notes (Signed)
Hgb 6.7 reported  Marshell Levan, RN

## 2011-04-03 NOTE — Progress Notes (Signed)
Subjective:  The patient reports that she is dizzy when she stands. She is able to ambulate but finds it difficult. She complains of gas pain that "simply won't pass".  Objective:  BP 118/76  Pulse 90  Temp(Src) 98.4 F (36.9 C) (Oral)  Resp 20  Ht 5' 9.5" (1.765 m)  Wt 119.296 kg (263 lb)  BMI 38.28 kg/m2  SpO2 100%  Breastfeeding? Unknown  CBC    Component Value Date/Time   WBC 11.3* 04/03/2011 0532   RBC 2.30* 04/03/2011 0532   HGB 6.7* 04/03/2011 0532   HCT 20.2* 04/03/2011 0532   PLT 199 04/03/2011 0532   MCV 87.8 04/03/2011 0532   MCH 29.1 04/03/2011 0532   MCHC 33.2 04/03/2011 0532   RDW 15.1 04/03/2011 0532   LYMPHSABS 2.2 11/24/2010 0537   MONOABS 0.9 11/24/2010 0537   EOSABS 0.0 11/24/2010 0537   BASOSABS 0.0 11/24/2010 0537    Chest clear  Heart: Regular rate and rhythm  Abdomen: Nontender, dressing is clean and dry, bowel sounds positive  Fundus: Firm  Lochia: Modest  Orthostatics: Blood pressure is stable but pulse is increased  Assessment:  Postpartum day #1  Postop day #1 bilateral tubal ligation  Anemia  I do not think the patient has intra-abdominal bleeding but we will watch the patient carefully.  Plan:  Management of anemia was reviewed. The risk and benefit of iron therapy and observation were reviewed. The risk and benefits of blood transfusion were reviewed. The patient elects to proceed with blood transfusion. We will give the patient 3 units of packed red blood cells.  The patient will be given a suppository to use as needed to help her pass gas.  We will plan to discharge the patient to home tomorrow.  Mylinda Latina.D.

## 2011-04-03 NOTE — Anesthesia Postprocedure Evaluation (Signed)
Anesthesia Post Note  Patient: Connie [name]  Procedure(s) Performed: * No procedures listed *  Anesthesia type: Epidural  Patient location: Mother/Baby  Post pain: Pain level controlled  Post assessment: Post-op Vital signs reviewed  Last Vitals:  Filed Vitals:   04/03/11 0615  BP: 114/72  Pulse: 69  Temp: 36.9 C  Resp: 18    Post vital signs: Reviewed  Level of consciousness: awake  Complications: No apparent anesthesia complications

## 2011-04-04 DIAGNOSIS — D649 Anemia, unspecified: Secondary | ICD-10-CM | POA: Diagnosis present

## 2011-04-04 DIAGNOSIS — Z3490 Encounter for supervision of normal pregnancy, unspecified, unspecified trimester: Secondary | ICD-10-CM

## 2011-04-04 DIAGNOSIS — Z9851 Tubal ligation status: Secondary | ICD-10-CM

## 2011-04-04 DIAGNOSIS — O10919 Unspecified pre-existing hypertension complicating pregnancy, unspecified trimester: Secondary | ICD-10-CM | POA: Diagnosis present

## 2011-04-04 LAB — TYPE AND SCREEN
ABO/RH(D): A POS
Unit division: 0
Unit division: 0

## 2011-04-04 LAB — CBC
HCT: 22.2 % — ABNORMAL LOW (ref 36.0–46.0)
MCH: 29.7 pg (ref 26.0–34.0)
MCHC: 34.2 g/dL (ref 30.0–36.0)
MCHC: 34.7 g/dL (ref 30.0–36.0)
MCV: 85.6 fL (ref 78.0–100.0)
Platelets: 149 10*3/uL — ABNORMAL LOW (ref 150–400)
Platelets: 156 10*3/uL (ref 150–400)
RDW: 15.1 % (ref 11.5–15.5)
RDW: 15.6 % — ABNORMAL HIGH (ref 11.5–15.5)

## 2011-04-04 MED ORDER — NIFEDIPINE ER OSMOTIC RELEASE 60 MG PO TB24: 60.0000 mg | ORAL_TABLET | Freq: Every day | ORAL | Status: DC

## 2011-04-04 MED ORDER — IBUPROFEN 600 MG PO TABS: 600.0000 mg | ORAL_TABLET | Freq: Four times a day (QID) | ORAL | Status: AC | PRN

## 2011-04-04 MED ORDER — FERROUS SULFATE 325 (65 FE) MG PO TABS
325.0000 mg | ORAL_TABLET | Freq: Two times a day (BID) | ORAL | Status: DC
  Administered 2011-04-04: 325 mg via ORAL
  Filled 2011-04-04: qty 1

## 2011-04-04 MED ORDER — NIFEDIPINE ER 60 MG PO TB24: 60.0000 mg | ORAL_TABLET | Freq: Once | ORAL | Status: DC

## 2011-04-04 MED ORDER — OXYCODONE-ACETAMINOPHEN 5-325 MG PO TABS: 1.0000 | ORAL_TABLET | ORAL | Status: AC | PRN

## 2011-04-04 MED ORDER — FERROUS SULFATE 325 (65 FE) MG PO TABS: 325.0000 mg | ORAL_TABLET | Freq: Two times a day (BID) | ORAL | Status: DC

## 2011-04-04 MED ORDER — NIFEDIPINE ER 60 MG PO TB24
60.0000 mg | ORAL_TABLET | Freq: Once | ORAL | Status: AC
  Administered 2011-04-04: 60 mg via ORAL
  Filled 2011-04-04: qty 1

## 2011-04-04 NOTE — Discharge Summary (Signed)
Obstetric Discharge Summary Reason for Admission: induction of labor due to chronic hypertension Prenatal Procedures: NST and ultrasound Intrapartum Procedures: spontaneous vaginal delivery Postpartum Procedures: transfusion 3 units, pp BTL Complications-Operative and Postpartum: anemia  Temp:  [98 F (36.7 C)-98.9 F (37.2 C)] 98.1 F (36.7 C) (11/05 0520) Pulse Rate:  [73-89] 73  (11/05 0520) Resp:  [18-20] 20  (11/05 0520) BP: (130-156)/(80-103) 156/103 mmHg (11/05 0520) SpO2:  [99 %-100 %] 100 % (11/04 2300) Hemoglobin  Date Value Range Status  04/04/2011 7.6* 12.0-15.0 (g/dL) Final     HCT  Date Value Range Status  04/04/2011 22.2* 36.0-46.0 (%) Final   Results for orders placed during the hospital encounter of 04/01/11 (from the past 24 hour(s))  PREPARE RBC (CROSSMATCH)     Status: Normal   Collection Time   04/03/11 10:30 AM      Component Value Range   Order Confirmation ORDER PROCESSED BY BLOOD BANK    TYPE AND SCREEN     Status: Normal   Collection Time   04/03/11 12:19 PM      Component Value Range   ABO/RH(D) A POS     Antibody Screen NEG     Sample Expiration 04/06/2011     Unit Number [id number]     Blood Component Type RED CELLS,LR     Unit division 00     Status of Unit ISSUED,FINAL     Transfusion Status OK TO TRANSFUSE     Crossmatch Result Compatible     Unit Number [id number]     Blood Component Type RED CELLS,LR     Unit division 00     Status of Unit ISSUED,FINAL     Transfusion Status OK TO TRANSFUSE     Crossmatch Result Compatible     Unit Number [id number]     Blood Component Type RED CELLS,LR     Unit division 00     Status of Unit ISSUED,FINAL     Transfusion Status OK TO TRANSFUSE     Crossmatch Result Compatible    ABO/RH     Status: Normal   Collection Time   04/03/11 12:20 PM      Component Value Range   ABO/RH(D) A POS    CBC     Status: Abnormal   Collection Time   04/04/11 12:01 AM      Component Value Range   WBC 8.6  4.0 -  10.5 (K/uL)   RBC 2.63 (*) 3.87 - 5.11 (MIL/uL)   Hemoglobin 7.8 (*) 12.0 - 15.0 (g/dL)   HCT 13.0 (*) 86.5 - 46.0 (%)   MCV 85.6  78.0 - 100.0 (fL)   MCH 29.7  26.0 - 34.0 (pg)   MCHC 34.7  30.0 - 36.0 (g/dL)   RDW 78.4  69.6 - 29.5 (%)   Platelets 156  150 - 400 (K/uL)  CBC     Status: Abnormal   Collection Time   04/04/11  6:33 AM      Component Value Range   WBC 8.8  4.0 - 10.5 (K/uL)   RBC 2.61 (*) 3.87 - 5.11 (MIL/uL)   Hemoglobin 7.6 (*) 12.0 - 15.0 (g/dL)   HCT 28.4 (*) 13.2 - 46.0 (%)   MCV 85.1  78.0 - 100.0 (fL)   MCH 29.1  26.0 - 34.0 (pg)   MCHC 34.2  30.0 - 36.0 (g/dL)   RDW 44.0 (*) 10.2 - 15.5 (%)   Platelets 149 (*) 150 - 400 (K/uL)  Hgb 9.8 on admission.   Hospital Course:  Admitted on 04/01/11 for induction due to chronic hypertension at term, with cytotech, then pitocin.  Progressed to delivery on the afternoon of 04/02/11, by Dr. Stefano Gaul, without complication.  Patient desired BTL--this was done on 04/02/11 under existing epidural anesthesia.  Her Hgb was 6.1 after delivery, and she was symptomatic--3 units PRBC were giving with Hgb stable at 7.6 on 04/04/11.  She was recovering well from the BTL, passing gas, and tolerating a regular diet without difficulty.  She had been started on Procardia after delivery for elevated BPs, then that was discontinued while patient was symptomatic from anemia.  She was restarted on Procardia 60 mg XL on the day of discharge.  She was doing well with pain control via po meds, and she was ready for discharge home on 04/04/11.  She was bottlefeeding.  Her 1st dose of Procardia 60 XL was given before discharge, and the  [facility] nurse will see patient in 48 hours and report findings of BP eval to [facility] .  Discharge Diagnoses: Term Pregnancy-delivered and anemia and chronic hypertension  Discharge Information: Date: 04/04/2011 Activity: Per [facility] handout Diet: routine Medications:  Medication List  As of 04/04/2011 10:00 AM   START  taking these medications         ferrous sulfate 325 (65 FE) MG tablet   Take 1 tablet (325 mg total) by mouth 2 (two) times daily with a meal.      ibuprofen 600 MG tablet   Commonly known as: ADVIL,MOTRIN   Take 1 tablet (600 mg total) by mouth every 6 (six) hours as needed for pain.      * NIFEdipine 60 MG 24 hr tablet   Commonly known as: PROCARDIA XL/ADALAT-CC   Take 1 tablet (60 mg total) by mouth daily.      * NIFEdipine 60 MG 24 hr tablet   Commonly known as: PROCARDIA-XL/ADALAT CC   Take 1 tablet (60 mg total) by mouth once.      oxyCODONE-acetaminophen 5-325 MG per tablet   Commonly known as: PERCOCET   Take 1-2 tablets by mouth every 4 (four) hours as needed.     * Notice: This list has 2 medication(s) that are the same as other medications prescribed for you. Read the directions carefully, and ask your doctor or other care provider to review them with you.       CONTINUE taking these medications         albuterol 108 (90 BASE) MCG/ACT inhaler   Commonly known as: PROVENTIL HFA;VENTOLIN HFA      prenatal vitamin w/FE, FA 27-1 MG Tabs          Where to get your medications    These are the prescriptions that you need to pick up.   You may get these medications from any pharmacy.         ferrous sulfate 325 (65 FE) MG tablet   ibuprofen 600 MG tablet   NIFEdipine 60 MG 24 hr tablet   oxyCODONE-acetaminophen 5-325 MG per tablet           Condition: stable Instructions: refer to practice specific booklet Discharge to: home Follow-up Information    Follow up with [facility] in 6 weeks.         Newborn Data: Live born  Information for the patient's newborn:  Perina, Salvaggio [[id number]]  female ; APGAR 9/9, ; weight  7+2;  Home with mother.  Connie [name] 04/04/2011, 10:00 AM

## 2011-04-04 NOTE — Progress Notes (Signed)
SW consult received for "babies who have drug screen sent." SW reviewed babies chart and there has not been any drug screens ordered.  Therefore, SW has screened out this referral as an error.  SW will only see if appropriate consult is ordered. 

## 2011-04-04 NOTE — Progress Notes (Signed)
UR chart review completed.  

## 2011-04-05 ENCOUNTER — Encounter (HOSPITAL_COMMUNITY): Payer: Self-pay | Admitting: Obstetrics and Gynecology

## 2011-05-09 NOTE — Telephone Encounter (Signed)
Preadmission screen  

## 2011-12-17 ENCOUNTER — Encounter (HOSPITAL_COMMUNITY): Payer: Self-pay | Admitting: Emergency Medicine

## 2011-12-17 ENCOUNTER — Emergency Department (HOSPITAL_COMMUNITY)
Admission: EM | Admit: 2011-12-17 | Discharge: 2011-12-18 | Disposition: A | Discharge status: Home / Self Care | Payer: Medicaid Other | Attending: Emergency Medicine | Admitting: Emergency Medicine

## 2011-12-17 DIAGNOSIS — F17200 Nicotine dependence, unspecified, uncomplicated: Secondary | ICD-10-CM | POA: Insufficient documentation

## 2011-12-17 DIAGNOSIS — Z886 Allergy status to analgesic agent status: Secondary | ICD-10-CM | POA: Insufficient documentation

## 2011-12-17 DIAGNOSIS — K029 Dental caries, unspecified: Secondary | ICD-10-CM | POA: Insufficient documentation

## 2011-12-17 DIAGNOSIS — Z880 Allergy status to penicillin: Secondary | ICD-10-CM | POA: Insufficient documentation

## 2011-12-17 DIAGNOSIS — J45909 Unspecified asthma, uncomplicated: Secondary | ICD-10-CM | POA: Insufficient documentation

## 2011-12-17 DIAGNOSIS — K0889 Other specified disorders of teeth and supporting structures: Secondary | ICD-10-CM

## 2011-12-17 DIAGNOSIS — I10 Essential (primary) hypertension: Secondary | ICD-10-CM | POA: Insufficient documentation

## 2011-12-17 DIAGNOSIS — K219 Gastro-esophageal reflux disease without esophagitis: Secondary | ICD-10-CM | POA: Insufficient documentation

## 2011-12-17 MED ORDER — OXYCODONE-ACETAMINOPHEN 5-325 MG PO TABS: 1.0000 | ORAL_TABLET | ORAL | Status: AC | PRN

## 2011-12-17 MED ORDER — OXYCODONE-ACETAMINOPHEN 5-325 MG PO TABS: 2.0000 | ORAL_TABLET | ORAL | Status: DC | PRN

## 2011-12-17 MED ORDER — KETOROLAC TROMETHAMINE 30 MG/ML IJ SOLN
INTRAMUSCULAR | Status: AC
  Administered 2011-12-17: 60 mg
  Filled 2011-12-17: qty 2

## 2011-12-17 MED ORDER — IBUPROFEN 800 MG PO TABS: 800.0000 mg | ORAL_TABLET | Freq: Three times a day (TID) | ORAL | Status: DC

## 2011-12-17 MED ORDER — CLINDAMYCIN HCL 150 MG PO CAPS: 300.0000 mg | ORAL_CAPSULE | Freq: Three times a day (TID) | ORAL | Status: DC

## 2011-12-17 MED ORDER — CLINDAMYCIN HCL 150 MG PO CAPS: 300.0000 mg | ORAL_CAPSULE | Freq: Three times a day (TID) | ORAL | Status: AC

## 2011-12-17 MED ORDER — IBUPROFEN 800 MG PO TABS: 800.0000 mg | ORAL_TABLET | Freq: Three times a day (TID) | ORAL | Status: AC

## 2011-12-17 MED ORDER — KETOROLAC TROMETHAMINE 60 MG/2ML IM SOLN: 60.0000 mg | Freq: Once | INTRAMUSCULAR | Status: AC

## 2011-12-17 NOTE — ED Notes (Signed)
Patient complaining of right lower dental pain that started three weeks ago; patient reports hole in tooth.

## 2011-12-17 NOTE — ED Provider Notes (Signed)
History     CSN: [id number]  Arrival date & time 12/17/11  2139   First MD Initiated Contact with Patient 12/17/11 2207      Chief Complaint  Patient presents with  . Dental Pain    (Consider location/radiation/quality/duration/timing/severity/associated sxs/prior treatment) Patient is a 31 y.o. female presenting with tooth pain. The history is provided by the patient.  Dental PainThe primary symptoms include mouth pain and headaches. Primary symptoms do not include dental injury, fever or sore throat. The symptoms began more than 1 week ago. The symptoms are worsening. The symptoms occur constantly.  Additional symptoms include: ear pain.  Pt states she has had a toothache for the last 2 weeks. States this is a recurrent issue. Hx of problem with that tooth in the past. She was seen by her dentist this past week, was referred to an oral surgeon, appointment with oral sugery not until august 20th. Pt states pain worsened tonight. Took vicodin with no relief. Denies fever, chills, facial swelling.   Past Medical History  Diagnosis Date  . Hypertension   . Asthma   . GERD (gastroesophageal reflux disease)   . Headache   . History of gonorrhea   . History of chlamydia   . Pregnancy induced hypertension     previous pregnancy  . Increased BMI   . Varicose veins     Past Surgical History  Procedure Date  . Tonsillectomy   . Cholecystectomy, laparoscopic   . Adenoidectomy   . Breast cyst excision 2008  . Tubal ligation 04/02/2011    Procedure: POST PARTUM TUBAL LIGATION;  Surgeon: Janine Limbo, MD;  Location: [facility];  Service: Gynecology;  Laterality: Bilateral;  Bilateral post partum tubal ligation    Family History  Problem Relation Age of Onset  . Heart attack Maternal Uncle   . Drug abuse Maternal Uncle     crack addiction  . Heart attack Maternal Grandmother   . Peripheral vascular disease Maternal Grandmother   . Depression Maternal Grandmother   . Mental  retardation Maternal Grandmother     OCD  . Cancer Paternal Grandmother     lung  . Anesthesia problems Neg Hx   . Hypotension Neg Hx   . Malignant hyperthermia Neg Hx   . Pseudochol deficiency Neg Hx     History  Substance Use Topics  . Smoking status: Current Some Day Smoker -- 0.2 packs/day for 20 years    Types: Cigarettes  . Smokeless tobacco: Never Used  . Alcohol Use: No    OB History    Grav Para Term Preterm Abortions TAB SAB Ect Mult Living   4 3 2  1 1    3       Review of Systems  Constitutional: Negative for fever and chills.  HENT: Positive for ear pain and dental problem. Negative for congestion and sore throat.   Respiratory: Negative.   Cardiovascular: Negative.   Gastrointestinal: Negative.   Neurological: Positive for headaches.    Allergies  Dilaudid; Amoxicillin; and Latex  Home Medications   Current Outpatient Rx  Name Route Sig Dispense Refill  . ALBUTEROL SULFATE HFA 108 (90 BASE) MCG/ACT IN AERS Inhalation Inhale 2 puffs into the lungs every 6 (six) hours as needed. Shortness of breath    . VICODIN PO Oral Take 1 tablet by mouth daily.    . IBUPROFEN 200 MG PO TABS Oral Take 200 mg by mouth every 6 (six) hours as needed. For pain  BP 164/111  Pulse 82  Temp 99 F (37.2 C) (Oral)  Resp 20  SpO2 97%  LMP 12/11/2011  Physical Exam  Nursing note and vitals reviewed. Constitutional: She is oriented to person, place, and time. She appears well-developed and well-nourished.       Uncomfortable appearing  HENT:  Head: Normocephalic and atraumatic.       Multiple dental carries. Right lower 2nd molar with large cavity, tender to palpation. Surrounding gum swelling and tenderness  Eyes: Conjunctivae are normal.  Neck: Normal range of motion. Neck supple.  Cardiovascular: Normal rate, regular rhythm and normal heart sounds.   Pulmonary/Chest: Effort normal and breath sounds normal. No respiratory distress. She has no wheezes. She has no  rales.  Lymphadenopathy:    She has no cervical adenopathy.  Neurological: She is alert and oriented to person, place, and time.  Skin: Skin is warm and dry.    ED Course  Procedures (including critical care time)  Pt with multiple dental carries. Possible early dental abscess of lower right 2nd molar. Pt states her dentist gave her amoxicillin, however, she is allergic. Will switch to clindamycin, pain meds, follow up with oral surgery. Pt otherwise non toxic, afebrile, no drainable abscess on exam.    1. Pain, dental       MDM         Lottie Mussel, PA 12/19/11 1547

## 2011-12-17 NOTE — ED Notes (Signed)
Informed pt that blood pressure was elevated.  Pt stated she is aware and it usually runs high. Pt stated she does not have anymore refills on her medication but stated she is willing to wait here to receive PO meds prior to discharge.  Will notify MD.

## 2011-12-19 NOTE — ED Provider Notes (Signed)
Medical screening examination/treatment/procedure(s) were performed by non-physician practitioner and as supervising physician I was immediately available for consultation/collaboration.   Artina Minella, MD 12/19/11 2305 

## 2012-08-19 ENCOUNTER — Emergency Department (HOSPITAL_COMMUNITY)
Admission: EM | Admit: 2012-08-19 | Discharge: 2012-08-19 | Disposition: A | Discharge status: Home / Self Care | Payer: Medicaid Other | Attending: Emergency Medicine | Admitting: Emergency Medicine

## 2012-08-19 ENCOUNTER — Encounter (HOSPITAL_COMMUNITY): Payer: Self-pay | Admitting: *Deleted

## 2012-08-19 DIAGNOSIS — R51 Headache: Secondary | ICD-10-CM | POA: Insufficient documentation

## 2012-08-19 DIAGNOSIS — Z9851 Tubal ligation status: Secondary | ICD-10-CM | POA: Insufficient documentation

## 2012-08-19 DIAGNOSIS — R638 Other symptoms and signs concerning food and fluid intake: Secondary | ICD-10-CM | POA: Insufficient documentation

## 2012-08-19 DIAGNOSIS — K219 Gastro-esophageal reflux disease without esophagitis: Secondary | ICD-10-CM | POA: Insufficient documentation

## 2012-08-19 DIAGNOSIS — J45909 Unspecified asthma, uncomplicated: Secondary | ICD-10-CM | POA: Insufficient documentation

## 2012-08-19 DIAGNOSIS — I10 Essential (primary) hypertension: Secondary | ICD-10-CM | POA: Insufficient documentation

## 2012-08-19 DIAGNOSIS — N644 Mastodynia: Secondary | ICD-10-CM | POA: Insufficient documentation

## 2012-08-19 DIAGNOSIS — F17200 Nicotine dependence, unspecified, uncomplicated: Secondary | ICD-10-CM | POA: Insufficient documentation

## 2012-08-19 DIAGNOSIS — N61 Mastitis without abscess: Secondary | ICD-10-CM | POA: Insufficient documentation

## 2012-08-19 DIAGNOSIS — Z9089 Acquired absence of other organs: Secondary | ICD-10-CM | POA: Insufficient documentation

## 2012-08-19 DIAGNOSIS — Z79899 Other long term (current) drug therapy: Secondary | ICD-10-CM | POA: Insufficient documentation

## 2012-08-19 DIAGNOSIS — Z8619 Personal history of other infectious and parasitic diseases: Secondary | ICD-10-CM | POA: Insufficient documentation

## 2012-08-19 DIAGNOSIS — N611 Abscess of the breast and nipple: Secondary | ICD-10-CM

## 2012-08-19 DIAGNOSIS — Z792 Long term (current) use of antibiotics: Secondary | ICD-10-CM | POA: Insufficient documentation

## 2012-08-19 DIAGNOSIS — I8390 Asymptomatic varicose veins of unspecified lower extremity: Secondary | ICD-10-CM | POA: Insufficient documentation

## 2012-08-19 DIAGNOSIS — R21 Rash and other nonspecific skin eruption: Secondary | ICD-10-CM | POA: Insufficient documentation

## 2012-08-19 DIAGNOSIS — O139 Gestational [pregnancy-induced] hypertension without significant proteinuria, unspecified trimester: Secondary | ICD-10-CM | POA: Insufficient documentation

## 2012-08-19 MED ORDER — SULFAMETHOXAZOLE-TRIMETHOPRIM 800-160 MG PO TABS: 1.0000 | ORAL_TABLET | Freq: Two times a day (BID) | ORAL | Status: DC

## 2012-08-19 MED ORDER — SULFAMETHOXAZOLE-TMP DS 800-160 MG PO TABS
1.0000 | ORAL_TABLET | Freq: Once | ORAL | Status: AC
  Administered 2012-08-19: 1 via ORAL
  Filled 2012-08-19: qty 1

## 2012-08-19 MED ORDER — HYDROCODONE-ACETAMINOPHEN 5-325 MG PO TABS: 2.0000 | ORAL_TABLET | ORAL | Status: DC | PRN

## 2012-08-19 NOTE — ED Notes (Signed)
Pt reports having abscess or cyst to right nipple since Thursday, causing severe pain, hx of same in left breast.

## 2012-08-19 NOTE — ED Provider Notes (Signed)
History     CSN: [id number]  Arrival date & time 08/19/12  1127   First MD Initiated Contact with Patient 08/19/12 1246      Chief Complaint  Patient presents with  . Breast Mass    (Consider location/radiation/quality/duration/timing/severity/associated sxs/prior treatment) HPI Comments: 32 year old female with a history of abscess of her left breast presents with an abscess of her right breast. She states that over the last week she has had a gradual onset of progressive swelling to the right nipple. This is persistent, worse with palpation and not associated with fevers. She states that she had a child in 2012 but has not had much breast-feeding in the last 6 months, she still has the occasional nipple discharge bilaterally of milk but has not had any discharge in the last week either bloody or purulent. She has been performing hot compresses over the last 12 hours with no improvement  The history is provided by the patient and a relative.    Past Medical History  Diagnosis Date  . Hypertension   . Asthma   . GERD (gastroesophageal reflux disease)   . Headache   . History of gonorrhea   . History of chlamydia   . Pregnancy induced hypertension     previous pregnancy  . Increased BMI   . Varicose veins     Past Surgical History  Procedure Laterality Date  . Tonsillectomy    . Cholecystectomy, laparoscopic    . Adenoidectomy    . Breast cyst excision  2008  . Tubal ligation  04/02/2011    Procedure: POST PARTUM TUBAL LIGATION;  Surgeon: Janine Limbo, MD;  Location: [facility];  Service: Gynecology;  Laterality: Bilateral;  Bilateral post partum tubal ligation    Family History  Problem Relation Age of Onset  . Heart attack Maternal Uncle   . Drug abuse Maternal Uncle     crack addiction  . Heart attack Maternal Grandmother   . Peripheral vascular disease Maternal Grandmother   . Depression Maternal Grandmother   . Mental retardation Maternal Grandmother     OCD   . Cancer Paternal Grandmother     lung  . Anesthesia problems Neg Hx   . Hypotension Neg Hx   . Malignant hyperthermia Neg Hx   . Pseudochol deficiency Neg Hx     History  Substance Use Topics  . Smoking status: Current Some Day Smoker -- 0.25 packs/day for 20 years    Types: Cigarettes  . Smokeless tobacco: Never Used  . Alcohol Use: No    OB History   Grav Para Term Preterm Abortions TAB SAB Ect Mult Living   4 3 2  1 1    3       Review of Systems  Constitutional: Negative for fever.  Skin: Positive for rash.       Abscess of R nipple    Allergies  Dilaudid; Amoxicillin; and Latex  Home Medications   Current Outpatient Rx  Name  Route  Sig  Dispense  Refill  . albuterol (PROVENTIL HFA;VENTOLIN HFA) 108 (90 BASE) MCG/ACT inhaler   Inhalation   Inhale 2 puffs into the lungs every 6 (six) hours as needed. Shortness of breath         . HYDROcodone-acetaminophen (NORCO/VICODIN) 5-325 MG per tablet   Oral   Take 2 tablets by mouth every 4 (four) hours as needed for pain.   10 tablet   0   . sulfamethoxazole-trimethoprim (SEPTRA DS) 800-160 MG per  tablet   Oral   Take 1 tablet by mouth every 12 (twelve) hours.   20 tablet   0     BP 159/101  Pulse 74  Temp(Src) 98.5 F (36.9 C) (Oral)  Resp 18  SpO2 98%  LMP 08/03/2012  Physical Exam  Nursing note and vitals reviewed. Constitutional: She appears well-developed and well-nourished. No distress.  HENT:  Head: Normocephalic and atraumatic.  Eyes: Conjunctivae are normal.  Cardiovascular: Normal rate, regular rhythm and intact distal pulses.   Pulmonary/Chest: Effort normal and breath sounds normal. No respiratory distress. She has no wheezes. She has no rales. Tenderness:  Chaperone present for breast exam, left breast appears normal, right breast with one and a half centimeter area of swelling and tenderness in the middle of the nipple, no discharge from the nipple, no induration or fluctuance.   Abdominal: Soft. There is no tenderness.  Skin: Skin is warm. There is erythema.    ED Course  Procedures (including critical care time)  Labs Reviewed - No data to display No results found.   1. Breast abscess       MDM  Exam consistent with a mastitis/abscess of the nipple, antibiotics, warm soaks, the patient is afebrile and nontoxic, precautions given for return, and general surgery followup  Patient appears stable for discharge on Bactrim and hydrocodone      Vida Roller, MD 08/19/12 1313

## 2014-03-31 ENCOUNTER — Encounter (HOSPITAL_COMMUNITY): Payer: Self-pay | Admitting: *Deleted

## 2014-05-12 ENCOUNTER — Emergency Department (HOSPITAL_COMMUNITY): Payer: Medicaid Other

## 2014-05-12 ENCOUNTER — Emergency Department (HOSPITAL_COMMUNITY)
Admission: EM | Admit: 2014-05-12 | Discharge: 2014-05-12 | Disposition: A | Discharge status: Home / Self Care | Payer: Medicaid Other | Attending: Emergency Medicine | Admitting: Emergency Medicine

## 2014-05-12 ENCOUNTER — Encounter (HOSPITAL_COMMUNITY): Payer: Self-pay | Admitting: *Deleted

## 2014-05-12 DIAGNOSIS — J45909 Unspecified asthma, uncomplicated: Secondary | ICD-10-CM | POA: Diagnosis not present

## 2014-05-12 DIAGNOSIS — Z8619 Personal history of other infectious and parasitic diseases: Secondary | ICD-10-CM | POA: Insufficient documentation

## 2014-05-12 DIAGNOSIS — Z8719 Personal history of other diseases of the digestive system: Secondary | ICD-10-CM | POA: Diagnosis not present

## 2014-05-12 DIAGNOSIS — Z720 Tobacco use: Secondary | ICD-10-CM | POA: Diagnosis not present

## 2014-05-12 DIAGNOSIS — Z880 Allergy status to penicillin: Secondary | ICD-10-CM | POA: Insufficient documentation

## 2014-05-12 DIAGNOSIS — M25572 Pain in left ankle and joints of left foot: Secondary | ICD-10-CM | POA: Diagnosis present

## 2014-05-12 DIAGNOSIS — I10 Essential (primary) hypertension: Secondary | ICD-10-CM | POA: Diagnosis not present

## 2014-05-12 DIAGNOSIS — Z91040 Latex allergy status: Secondary | ICD-10-CM | POA: Insufficient documentation

## 2014-05-12 DIAGNOSIS — Z79899 Other long term (current) drug therapy: Secondary | ICD-10-CM | POA: Diagnosis not present

## 2014-05-12 MED ORDER — INDOMETHACIN 25 MG PO CAPS: 25.0000 mg | ORAL_CAPSULE | Freq: Three times a day (TID) | ORAL | Status: DC | PRN

## 2014-05-12 MED ORDER — OXYCODONE-ACETAMINOPHEN 5-325 MG PO TABS: 1.0000 | ORAL_TABLET | Freq: Four times a day (QID) | ORAL | Status: DC | PRN

## 2014-05-12 NOTE — ED Notes (Signed)
Pt reports left ankle pain that started yesterday morning. Pt states she tried ice and heat but it made pain worse. Pressure makes pain feel better, and not having pressure on ankle makes pain worse.Pt rates pain 10/10. Pt states she tried otc pain medications with no relief. Swelling noted to upper portion of ankle.

## 2014-05-12 NOTE — ED Notes (Signed)
Patient discharged at this time with prescriptions for pain.  No pain meds given in the ED.  Patient driving.

## 2014-05-12 NOTE — ED Provider Notes (Signed)
CSN: [id number]     Arrival date & time 05/12/14  0116 History  This chart was scribed for Geoffery Lyonsouglas Camiya Vinal, MD by Annye AsaAnna Dorsett, ED Scribe. This patient was seen in room A04C/A04C and the patient's care was started at 2:37 AM.    Chief Complaint  Patient presents with  . Ankle Pain   Patient is a 33 y.o. female presenting with ankle pain. The history is provided by the patient. No language interpreter was used.  Ankle Pain Location:  Ankle Time since incident:  1 day Injury: no   Ankle location:  L ankle Chronicity:  New Dislocation: no   Foreign body present:  No foreign bodies Prior injury to area:  No Relieved by:  Nothing Worsened by:  Nothing tried    HPI Comments: Connie [name] is a 33 y.o. female who presents to the Emergency Department complaining of 1 day of ankle pain beginning yesterday morning. Patient reports that she woke as normal yesterday and noticed that the front of her ankle hurt; she denies recent trauma or injury. Her pain is worse with ambulation/bearing weight.   Patient drove herself to the ED tonight.   Past Medical History  Diagnosis Date  . Hypertension   . Asthma   . GERD (gastroesophageal reflux disease)   . Headache(784.0)   . History of gonorrhea   . History of chlamydia   . Pregnancy induced hypertension     previous pregnancy  . Increased BMI   . Varicose veins    Past Surgical History  Procedure Laterality Date  . Tonsillectomy    . Cholecystectomy, laparoscopic    . Adenoidectomy    . Breast cyst excision  2008  . Tubal ligation  04/02/2011    Procedure: POST PARTUM TUBAL LIGATION;  Surgeon: Janine LimboArthur V Stringer, MD;  Location: [facility];  Service: Gynecology;  Laterality: Bilateral;  Bilateral post partum tubal ligation   Family History  Problem Relation Age of Onset  . Heart attack Maternal Uncle   . Drug abuse Maternal Uncle     crack addiction  . Heart attack Maternal Grandmother   . Peripheral vascular disease Maternal Grandmother    . Depression Maternal Grandmother   . Mental retardation Maternal Grandmother     OCD  . Cancer Paternal Grandmother     lung  . Anesthesia problems Neg Hx   . Hypotension Neg Hx   . Malignant hyperthermia Neg Hx   . Pseudochol deficiency Neg Hx    History  Substance Use Topics  . Smoking status: Current Some Day Smoker -- 0.25 packs/day for 20 years    Types: Cigarettes  . Smokeless tobacco: Never Used  . Alcohol Use: No   OB History    Gravida Para Term Preterm AB TAB SAB Ectopic Multiple Living   4 3 2  1 1    3      Review of Systems  A complete 10 system review of systems was obtained and all systems are negative except as noted in the HPI and PMH.   Allergies  Dilaudid; Amoxicillin; and Latex  Home Medications   Prior to Admission medications   Medication Sig Start Date End Date Taking? Authorizing Provider  albuterol (PROVENTIL HFA;VENTOLIN HFA) 108 (90 BASE) MCG/ACT inhaler Inhale 2 puffs into the lungs every 6 (six) hours as needed. Shortness of breath   Yes Historical Provider, MD  HYDROcodone-acetaminophen (NORCO/VICODIN) 5-325 MG per tablet Take 2 tablets by mouth every 4 (four) hours as needed for  pain. 08/19/12  Yes Vida RollerBrian D Miller, MD  sulfamethoxazole-trimethoprim (SEPTRA DS) 800-160 MG per tablet Take 1 tablet by mouth every 12 (twelve) hours. Patient not taking: Reported on 05/12/2014 08/19/12   Vida RollerBrian D Miller, MD   BP 153/106 mmHg  Pulse 88  Temp(Src) 98.4 F (36.9 C) (Oral)  Resp 20  SpO2 99%  LMP 04/28/2014 Physical Exam  Constitutional: She is oriented to person, place, and time. She appears well-developed and well-nourished.  HENT:  Head: Normocephalic and atraumatic.  Neck: No tracheal deviation present.  Cardiovascular: Normal rate.   Pulmonary/Chest: Effort normal and breath sounds normal.  Musculoskeletal:  The left ankle appears grossly normal; there is TTP over the anterior aspect. The dorsalis pedis pulse is palpable. There is no calf  tenderness or swelling. There is pain with ROM of the ankle.   Neurological: She is alert and oriented to person, place, and time.  Skin: Skin is warm and dry.  Psychiatric: She has a normal mood and affect. Her behavior is normal.  Nursing note and vitals reviewed.   ED Course  Procedures   DIAGNOSTIC STUDIES: Oxygen Saturation is 99% on RA, normal by my interpretation.    COORDINATION OF CARE: 2:40 AM Discussed treatment plan with pt at bedside and pt agreed to plan.   Labs Review Labs Reviewed - No data to display  Imaging Review No results found.   EKG Interpretation None      MDM   Final diagnoses:  None    Patient is a 33 year old female who presents with complaints of severe left ankle pain in the absence of any injury or trauma. Her physical examination reveals no joint effusion palpable and no warmth. Her x-rays reveal no osseous abnormality and no evidence for joint effusion. I have considered septic arthritis, however there is no fever and no warmth. I am uncertain as to the exact etiology of her discomfort, however gout is a strong possibility. She will be treated with indomethacin, pain meds, and when necessary return.   I personally performed the services described in this documentation, which was scribed in my presence. The recorded information has been reviewed and is accurate.      Geoffery Lyonsouglas Skyylar Kopf, MD 05/12/14 [phone]

## 2014-05-12 NOTE — Discharge Instructions (Signed)
Indomethacin as prescribed.  Percocet as prescribed as needed for pain.  Return to the emergency department if your symptoms substantially worsen or change.   Ankle Pain Ankle pain is a common symptom. The bones, cartilage, tendons, and muscles of the ankle joint perform a lot of work each day. The ankle joint holds your body weight and allows you to move around. Ankle pain can occur on either side or back of 1 or both ankles. Ankle pain may be sharp and burning or dull and aching. There may be tenderness, stiffness, redness, or warmth around the ankle. The pain occurs more often when a person walks or puts pressure on the ankle. CAUSES  There are many reasons ankle pain can develop. It is important to work with your caregiver to identify the cause since many conditions can impact the bones, cartilage, muscles, and tendons. Causes for ankle pain include:  Injury, including a break (fracture), sprain, or strain often due to a fall, sports, or a high-impact activity.  Swelling (inflammation) of a tendon (tendonitis).  Achilles tendon rupture.  Ankle instability after repeated sprains and strains.  Poor foot alignment.  Pressure on a nerve (tarsal tunnel syndrome).  Arthritis in the ankle or the lining of the ankle.  Crystal formation in the ankle (gout or pseudogout). DIAGNOSIS  A diagnosis is based on your medical history, your symptoms, results of your physical exam, and results of diagnostic tests. Diagnostic tests may include X-ray exams or a computerized magnetic scan (magnetic resonance imaging, MRI). TREATMENT  Treatment will depend on the cause of your ankle pain and may include:  Keeping pressure off the ankle and limiting activities.  Using crutches or other walking support (a cane or brace).  Using rest, ice, compression, and elevation.  Participating in physical therapy or home exercises.  Wearing shoe inserts or special shoes.  Losing weight.  Taking medications  to reduce pain or swelling or receiving an injection.  Undergoing surgery. HOME CARE INSTRUCTIONS   Only take over-the-counter or prescription medicines for pain, discomfort, or fever as directed by your caregiver.  Put ice on the injured area.  Put ice in a plastic bag.  Place a towel between your skin and the bag.  Leave the ice on for 15-20 minutes at a time, 03-04 times a day.  Keep your leg raised (elevated) when possible to lessen swelling.  Avoid activities that cause ankle pain.  Follow specific exercises as directed by your caregiver.  Record how often you have ankle pain, the location of the pain, and what it feels like. This information may be helpful to you and your caregiver.  Ask your caregiver about returning to work or sports and whether you should drive.  Follow up with your caregiver for further examination, therapy, or testing as directed. SEEK MEDICAL CARE IF:   Pain or swelling continues or worsens beyond 1 week.  You have an oral temperature above 102 F (38.9 C).  You are feeling unwell or have chills.  You are having an increasingly difficult time with walking.  You have loss of sensation or other new symptoms.  You have questions or concerns. MAKE SURE YOU:   Understand these instructions.  Will watch your condition.  Will get help right away if you are not doing well or get worse. Document Released: 11/03/2009 Document Revised: 08/08/2011 Document Reviewed: 11/03/2009 [facility] Patient Information 2015 [place], [place]. This information is not intended to replace advice given to you by your health care provider. Make  sure you discuss any questions you have with your health care provider.

## 2014-05-12 NOTE — ED Notes (Signed)
The pt is c/o lt ankle pain since yesterday.  No known injury  lmp 2 weeks ago

## 2014-07-20 ENCOUNTER — Encounter (HOSPITAL_COMMUNITY): Payer: Self-pay

## 2014-07-20 ENCOUNTER — Inpatient Hospital Stay (HOSPITAL_COMMUNITY)
Admission: AD | Admit: 2014-07-20 | Discharge: 2014-07-21 | Disposition: A | Discharge status: Home / Self Care | Payer: Medicaid Other | Source: Ambulatory Visit | Attending: Obstetrics & Gynecology | Admitting: Obstetrics & Gynecology

## 2014-07-20 DIAGNOSIS — Z8249 Family history of ischemic heart disease and other diseases of the circulatory system: Secondary | ICD-10-CM | POA: Insufficient documentation

## 2014-07-20 DIAGNOSIS — F17210 Nicotine dependence, cigarettes, uncomplicated: Secondary | ICD-10-CM | POA: Diagnosis not present

## 2014-07-20 DIAGNOSIS — I10 Essential (primary) hypertension: Secondary | ICD-10-CM | POA: Insufficient documentation

## 2014-07-20 DIAGNOSIS — N751 Abscess of Bartholin's gland: Secondary | ICD-10-CM | POA: Diagnosis not present

## 2014-07-20 DIAGNOSIS — K219 Gastro-esophageal reflux disease without esophagitis: Secondary | ICD-10-CM | POA: Diagnosis not present

## 2014-07-20 DIAGNOSIS — N909 Noninflammatory disorder of vulva and perineum, unspecified: Secondary | ICD-10-CM | POA: Diagnosis present

## 2014-07-20 LAB — WET PREP, GENITAL
Trich, Wet Prep: NONE SEEN
YEAST WET PREP: NONE SEEN

## 2014-07-20 MED ORDER — OXYCODONE-ACETAMINOPHEN 5-325 MG PO TABS: 1.0000 | ORAL_TABLET | ORAL | Status: DC | PRN

## 2014-07-20 MED ORDER — OXYCODONE-ACETAMINOPHEN 5-325 MG PO TABS
2.0000 | ORAL_TABLET | Freq: Once | ORAL | Status: AC
  Administered 2014-07-20: 2 via ORAL
  Filled 2014-07-20: qty 2

## 2014-07-20 NOTE — MAU Provider Note (Signed)
History     CSN: [id number]  Arrival date and time: 07/20/14 2159   None     Chief Complaint  Patient presents with  . Cyst   HPI  Connie [name] is 34 y.o. [phone] who presents today wth a painful lump on the outside of her labia. She states that it has been there for about a day. She does report that she douches after each cycle and cleans her external genitalia with an antibacterial soap.   Past Medical History  Diagnosis Date  . Hypertension   . Asthma   . GERD (gastroesophageal reflux disease)   . Headache(784.0)   . History of gonorrhea   . History of chlamydia   . Pregnancy induced hypertension     previous pregnancy  . Increased BMI   . Varicose veins     Past Surgical History  Procedure Laterality Date  . Tonsillectomy    . Cholecystectomy, laparoscopic    . Adenoidectomy    . Breast cyst excision  2008  . Tubal ligation  04/02/2011    Procedure: POST PARTUM TUBAL LIGATION;  Surgeon: Janine Limbo, MD;  Location: [facility];  Service: Gynecology;  Laterality: Bilateral;  Bilateral post partum tubal ligation    Family History  Problem Relation Age of Onset  . Heart attack Maternal Uncle   . Drug abuse Maternal Uncle     crack addiction  . Heart attack Maternal Grandmother   . Peripheral vascular disease Maternal Grandmother   . Depression Maternal Grandmother   . Mental retardation Maternal Grandmother     OCD  . Cancer Paternal Grandmother     lung  . Anesthesia problems Neg Hx   . Hypotension Neg Hx   . Malignant hyperthermia Neg Hx   . Pseudochol deficiency Neg Hx     History  Substance Use Topics  . Smoking status: Current Every Day Smoker -- 0.25 packs/day for 20 years    Types: Cigarettes  . Smokeless tobacco: Current User  . Alcohol Use: Yes    Allergies:  Allergies  Allergen Reactions  . Dilaudid [Hydromorphone Hcl] Anaphylaxis and Hives    Throat swelling  . Amoxicillin Swelling  . Latex Hives    Prescriptions prior to  admission  Medication Sig Dispense Refill Last Dose  . albuterol (PROVENTIL HFA;VENTOLIN HFA) 108 (90 BASE) MCG/ACT inhaler Inhale 2 puffs into the lungs every 6 (six) hours as needed. Shortness of breath   Past Week at Unknown time  . ibuprofen (ADVIL,MOTRIN) 200 MG tablet Take 200 mg by mouth every 6 (six) hours as needed.   Past Week at Unknown time  . HYDROcodone-acetaminophen (NORCO/VICODIN) 5-325 MG per tablet Take 2 tablets by mouth every 4 (four) hours as needed for pain. 10 tablet 0 forever  . indomethacin (INDOCIN) 25 MG capsule Take 1 capsule (25 mg total) by mouth 3 (three) times daily as needed. 15 capsule 0   . oxyCODONE-acetaminophen (PERCOCET) 5-325 MG per tablet Take 1-2 tablets by mouth every 6 (six) hours as needed. 20 tablet 0   . sulfamethoxazole-trimethoprim (SEPTRA DS) 800-160 MG per tablet Take 1 tablet by mouth every 12 (twelve) hours. (Patient not taking: Reported on 05/12/2014) 20 tablet 0     ROS Physical Exam   Blood pressure 178/111, pulse 85, temperature 98 F (36.7 C), temperature source Oral, resp. rate 18, last menstrual period 07/06/2014.  Physical Exam  Nursing note and vitals reviewed. Constitutional: She is oriented to person, place, and time. She  appears well-developed and well-nourished. No distress.  Cardiovascular: Normal rate.   Respiratory: Effort normal.  GI: There is no tenderness.  Genitourinary:   Large non-fluctuant bartholins abscess on the right. Unable to I&D today. No redness. Very tender   Neurological: She is alert and oriented to person, place, and time.  Skin: Skin is warm and dry.  Psychiatric: She has a normal mood and affect.    MAU Course  Procedures  Results for orders placed or performed during the hospital encounter of 07/20/14 (from the past 24 hour(s))  Wet prep, genital     Status: Abnormal   Collection Time: 07/20/14 11:15 PM  Result Value Ref Range   Yeast Wet Prep HPF POC NONE SEEN NONE SEEN   Trich, Wet Prep  NONE SEEN NONE SEEN   Clue Cells Wet Prep HPF POC FEW (A) NONE SEEN   WBC, Wet Prep HPF POC FEW (A) NONE SEEN     Assessment and Plan   1. Bartholin's gland abscess    DC home Warm soaks QID Comfort measures reviewed Return to MAU as needed  Follow-up Information    [facility]   Specialty:  Obstetrics and Gynecology   Why:  If symptoms worsen   Contact information:   [address] [place] [phone]       Tawnya CrookHogan, Heather Donovan 07/20/2014, 11:24 PM

## 2014-07-20 NOTE — MAU Note (Signed)
Pt presents with a vaginal cyst that started yesterday and got worse today. Worst pain she's ever had

## 2014-07-20 NOTE — Discharge Instructions (Signed)
Bartholin's Cyst or Abscess °Bartholin's glands are small glands located within the folds of skin (labia) along the sides of the lower opening of the vagina (birth canal). A cyst may develop when the duct of the gland becomes blocked. When this happens, fluid that accumulates within the cyst can become infected. This is known as an abscess. The Bartholin gland produces a mucous fluid to lubricate the outside of the vagina during sexual intercourse. °SYMPTOMS  °· Patients with a small cyst may not have any symptoms. °· Mild discomfort to severe pain depending on the size of the cyst and if it is infected (abscess). °· Pain, redness, and swelling around the lower opening of the vagina. °· Painful intercourse. °· Pressure in the perineal area. °· Swelling of the lips of the vagina (labia). °· The cyst or abscess can be on one side or both sides of the vagina. °DIAGNOSIS  °· A large swelling is seen in the lower vagina area by your caregiver. °· Painful to touch. °· Redness and pain, if it is an abscess. °TREATMENT  °· Sometimes the cyst will go away on its own. °· Apply warm wet compresses to the area or take hot sitz baths several times a day. °· An incision to drain the cyst or abscess with local anesthesia. °· Culture the pus, if it is an abscess. °· Antibiotic treatment, if it is an abscess. °· Cut open the gland and suture the edges to make the opening of the gland bigger (marsupialization). °· Remove the whole gland if the cyst or abscess returns. °PREVENTION  °· Practice good hygiene. °· Clean the vaginal area with a mild soap and soft cloth when bathing. °· Do not rub hard in the vaginal area when bathing. °· Protect the crotch area with a padded cushion if you take long bike rides or ride horses. °· Be sure you are well lubricated when you have sexual intercourse. °HOME CARE INSTRUCTIONS  °· If your cyst or abscess was opened, a small piece of gauze, or a drain, may have been placed in the wound to allow  drainage. Do not remove this gauze or drain unless directed by your caregiver. °· Wear feminine pads, not tampons, as needed for any drainage or bleeding. °· If antibiotics were prescribed, take them exactly as directed. Finish the entire course. °· Only take over-the-counter or prescription medicines for pain, discomfort, or fever as directed by your caregiver. °SEEK IMMEDIATE MEDICAL CARE IF:  °· You have an increase in pain, redness, swelling, or drainage. °· You have bleeding from the wound which results in the use of more than the number of pads suggested by your caregiver in 24 hours. °· You have chills. °· You have a fever. °· You develop any new problems (symptoms) or aggravation of your existing condition. °MAKE SURE YOU:  °· Understand these instructions. °· Will watch your condition. °· Will get help right away if you are not doing well or get worse. °Document Released: 05/16/2005 Document Revised: 08/08/2011 Document Reviewed: 01/02/2008 °ExitCare® Patient Information ©2015 [employer]. This information is not intended to replace advice given to you by your health care provider. Make sure you discuss any questions you have with your health care provider. ° °

## 2014-07-21 LAB — GC/CHLAMYDIA PROBE AMP (~~LOC~~) NOT AT ARMC
Chlamydia: NEGATIVE
NEISSERIA GONORRHEA: NEGATIVE

## 2014-07-22 ENCOUNTER — Encounter (HOSPITAL_COMMUNITY): Payer: Self-pay | Admitting: Emergency Medicine

## 2014-07-22 ENCOUNTER — Inpatient Hospital Stay (HOSPITAL_COMMUNITY)
Admission: AD | Admit: 2014-07-22 | Discharge: 2014-07-22 | Discharge status: Against Medical Advice | Payer: Medicaid Other | Source: Ambulatory Visit | Attending: Family Medicine | Admitting: Family Medicine

## 2014-07-22 ENCOUNTER — Emergency Department (HOSPITAL_COMMUNITY)
Admission: EM | Admit: 2014-07-22 | Discharge: 2014-07-22 | Disposition: A | Discharge status: Home / Self Care | Payer: Medicaid Other | Attending: Emergency Medicine | Admitting: Emergency Medicine

## 2014-07-22 DIAGNOSIS — Z880 Allergy status to penicillin: Secondary | ICD-10-CM | POA: Insufficient documentation

## 2014-07-22 DIAGNOSIS — Z8619 Personal history of other infectious and parasitic diseases: Secondary | ICD-10-CM | POA: Diagnosis not present

## 2014-07-22 DIAGNOSIS — N751 Abscess of Bartholin's gland: Secondary | ICD-10-CM

## 2014-07-22 DIAGNOSIS — Z8719 Personal history of other diseases of the digestive system: Secondary | ICD-10-CM | POA: Insufficient documentation

## 2014-07-22 DIAGNOSIS — J45909 Unspecified asthma, uncomplicated: Secondary | ICD-10-CM | POA: Diagnosis not present

## 2014-07-22 DIAGNOSIS — I10 Essential (primary) hypertension: Secondary | ICD-10-CM | POA: Diagnosis not present

## 2014-07-22 DIAGNOSIS — Z91040 Latex allergy status: Secondary | ICD-10-CM | POA: Insufficient documentation

## 2014-07-22 DIAGNOSIS — Z720 Tobacco use: Secondary | ICD-10-CM | POA: Insufficient documentation

## 2014-07-22 MED ORDER — FENTANYL CITRATE 0.05 MG/ML IJ SOLN
50.0000 ug | Freq: Once | INTRAMUSCULAR | Status: AC
  Administered 2014-07-22: 50 ug via INTRAVENOUS
  Filled 2014-07-22: qty 2

## 2014-07-22 MED ORDER — OXYCODONE-ACETAMINOPHEN 5-325 MG PO TABS: 1.0000 | ORAL_TABLET | ORAL | Status: DC | PRN

## 2014-07-22 MED ORDER — LIDOCAINE HCL (PF) 1 % IJ SOLN
30.0000 mL | Freq: Once | INTRAMUSCULAR | Status: AC
  Administered 2014-07-22: 30 mL via INTRADERMAL
  Filled 2014-07-22: qty 30

## 2014-07-22 MED ORDER — FENTANYL CITRATE 0.05 MG/ML IJ SOLN
100.0000 ug | INTRAMUSCULAR | Status: DC | PRN
  Administered 2014-07-22 (×2): 100 ug via INTRAVENOUS
  Filled 2014-07-22 (×2): qty 2

## 2014-07-22 MED ORDER — DIPHENHYDRAMINE HCL 50 MG/ML IJ SOLN: 25.0000 mg | INTRAMUSCULAR | Status: DC | PRN

## 2014-07-22 NOTE — MAU Note (Addendum)
"  they have to do something today, I can't leave with it like it is". Is supposed to be on medicine for BP, hasn't been able to get to dr to get it.... No meds for about 4 months.

## 2014-07-22 NOTE — Discharge Instructions (Signed)
Bartholin's Cyst or Abscess °Bartholin's glands are small glands located within the folds of skin (labia) along the sides of the lower opening of the vagina (birth canal). A cyst may develop when the duct of the gland becomes blocked. When this happens, fluid that accumulates within the cyst can become infected. This is known as an abscess. The Bartholin gland produces a mucous fluid to lubricate the outside of the vagina during sexual intercourse. °SYMPTOMS  °· Patients with a small cyst may not have any symptoms. °· Mild discomfort to severe pain depending on the size of the cyst and if it is infected (abscess). °· Pain, redness, and swelling around the lower opening of the vagina. °· Painful intercourse. °· Pressure in the perineal area. °· Swelling of the lips of the vagina (labia). °· The cyst or abscess can be on one side or both sides of the vagina. °DIAGNOSIS  °· A large swelling is seen in the lower vagina area by your caregiver. °· Painful to touch. °· Redness and pain, if it is an abscess. °TREATMENT  °· Sometimes the cyst will go away on its own. °· Apply warm wet compresses to the area or take hot sitz baths several times a day. °· An incision to drain the cyst or abscess with local anesthesia. °· Culture the pus, if it is an abscess. °· Antibiotic treatment, if it is an abscess. °· Cut open the gland and suture the edges to make the opening of the gland bigger (marsupialization). °· Remove the whole gland if the cyst or abscess returns. °PREVENTION  °· Practice good hygiene. °· Clean the vaginal area with a mild soap and soft cloth when bathing. °· Do not rub hard in the vaginal area when bathing. °· Protect the crotch area with a padded cushion if you take long bike rides or ride horses. °· Be sure you are well lubricated when you have sexual intercourse. °HOME CARE INSTRUCTIONS  °· If your cyst or abscess was opened, a small piece of gauze, or a drain, may have been placed in the wound to allow  drainage. Do not remove this gauze or drain unless directed by your caregiver. °· Wear feminine pads, not tampons, as needed for any drainage or bleeding. °· If antibiotics were prescribed, take them exactly as directed. Finish the entire course. °· Only take over-the-counter or prescription medicines for pain, discomfort, or fever as directed by your caregiver. °SEEK IMMEDIATE MEDICAL CARE IF:  °· You have an increase in pain, redness, swelling, or drainage. °· You have bleeding from the wound which results in the use of more than the number of pads suggested by your caregiver in 24 hours. °· You have chills. °· You have a fever. °· You develop any new problems (symptoms) or aggravation of your existing condition. °MAKE SURE YOU:  °· Understand these instructions. °· Will watch your condition. °· Will get help right away if you are not doing well or get worse. °Document Released: 05/16/2005 Document Revised: 08/08/2011 Document Reviewed: 01/02/2008 °ExitCare® Patient Information ©2015 [employer]. This information is not intended to replace advice given to you by your health care provider. Make sure you discuss any questions you have with your health care provider. ° ° °Incision and Drainage °Incision and drainage is a procedure in which a sac-like structure (cystic structure) is opened and drained. The area to be drained usually contains material such as pus, fluid, or blood.  °LET YOUR CAREGIVER KNOW ABOUT:  °· Allergies to   medicine. °· Medicines taken, including vitamins, herbs, eyedrops, over-the-counter medicines, and creams. °· Use of steroids (by mouth or creams). °· Previous problems with anesthetics or numbing medicines. °· History of bleeding problems or blood clots. °· Previous surgery. °· Other health problems, including diabetes and kidney problems. °· Possibility of pregnancy, if this applies. °RISKS AND COMPLICATIONS °· Pain. °· Bleeding. °· Scarring. °· Infection. °BEFORE THE PROCEDURE  °You may  need to have an ultrasound or other imaging tests to see how large or deep your cystic structure is. Blood tests may also be used to determine if you have an infection or how severe the infection is. You may need to have a tetanus shot. °PROCEDURE  °The affected area is cleaned with a cleaning fluid. The cyst area will then be numbed with a medicine (local anesthetic). A small incision will be made in the cystic structure. A syringe or catheter may be used to drain the contents of the cystic structure, or the contents may be squeezed out. The area will then be flushed with a cleansing solution. After cleansing the area, it is often gently packed with a gauze or another wound dressing. Once it is packed, it will be covered with gauze and tape or some other type of wound dressing.  °AFTER THE PROCEDURE  °· Often, you will be allowed to go home right after the procedure. °· You may be given antibiotic medicine to prevent or heal an infection. °· If the area was packed with gauze or some other wound dressing, you will likely need to come back in 1 to 2 days to get it removed. °· The area should heal in about 14 days. °Document Released: 11/09/2000 Document Revised: 11/15/2011 Document Reviewed: 07/11/2011 °ExitCare® Patient Information ©2015 [employer]. This information is not intended to replace advice given to you by your health care provider. Make sure you discuss any questions you have with your health care provider. ° °

## 2014-07-22 NOTE — MAU Note (Signed)
Call [facility] that pt has presented there.

## 2014-07-22 NOTE — ED Notes (Signed)
MD Criss AlvineGoldston performing procedure, wanted second dose of Fentanyl.

## 2014-07-22 NOTE — ED Notes (Signed)
Large abscess noted to right labia, inside of labia swollen with a smaller area of smelling to left labia. Pt crying, states the size has grown since Sunday a lot and she has been monitoring the size with a mirror.

## 2014-07-22 NOTE — ED Provider Notes (Signed)
CSN: [id number]     Arrival date & time 07/22/14  1949 History   First MD Initiated Contact with Patient 07/22/14 2037     Chief Complaint  Patient presents with  . Abscess     (Consider location/radiation/quality/duration/timing/severity/associated sxs/prior Treatment) HPI  34 year old female presents with a right sided Bartholin's gland cyst. She states she started having pain and pressure 3-4 days ago. [facility] and was seen there and given pain medicine and told to do hot soaks. At that time their chart notes a Bartholin's gland abscess but states I and D unable to be performed (unclear why). Since then the abscess is growing larger. No fevers or drainage. Patient ran out of the Percocet she was given, states that it helped for small lots of time but then would wear off. Pain is currently severe.  Past Medical History  Diagnosis Date  . Hypertension   . Asthma   . GERD (gastroesophageal reflux disease)   . Headache(784.0)   . History of gonorrhea   . History of chlamydia   . Pregnancy induced hypertension     previous pregnancy  . Increased BMI   . Varicose veins    Past Surgical History  Procedure Laterality Date  . Tonsillectomy    . Cholecystectomy, laparoscopic    . Adenoidectomy    . Breast cyst excision  2008  . Tubal ligation  04/02/2011    Procedure: POST PARTUM TUBAL LIGATION;  Surgeon: Janine Limbo, MD;  Location: [facility];  Service: Gynecology;  Laterality: Bilateral;  Bilateral post partum tubal ligation   Family History  Problem Relation Age of Onset  . Heart attack Maternal Uncle   . Drug abuse Maternal Uncle     crack addiction  . Heart attack Maternal Grandmother   . Peripheral vascular disease Maternal Grandmother   . Depression Maternal Grandmother   . Mental retardation Maternal Grandmother     OCD  . Cancer Paternal Grandmother     lung  . Anesthesia problems Neg Hx   . Hypotension Neg Hx   . Malignant hyperthermia Neg Hx   .  Pseudochol deficiency Neg Hx    History  Substance Use Topics  . Smoking status: Current Every Day Smoker -- 0.25 packs/day for 20 years    Types: Cigarettes  . Smokeless tobacco: Current User  . Alcohol Use: Yes   OB History    Gravida Para Term Preterm AB TAB SAB Ectopic Multiple Living   Review of Systems  Constitutional: Negative for fever.  Gastrointestinal: Negative for vomiting and abdominal pain.  Genitourinary: Positive for vaginal pain.  All other systems reviewed and are negative.     Allergies  Dilaudid; Amoxicillin; and Latex  Home Medications   Prior to Admission medications   Medication Sig Start Date End Date Taking? Authorizing Provider  albuterol (PROVENTIL HFA;VENTOLIN HFA) 108 (90 BASE) MCG/ACT inhaler Inhale 2 puffs into the lungs every 6 (six) hours as needed. Shortness of breath   Yes Historical Provider, MD  ibuprofen (ADVIL,MOTRIN) 200 MG tablet Take 200 mg by mouth every 6 (six) hours as needed for moderate pain.     Historical Provider, MD  oxyCODONE-acetaminophen (PERCOCET/ROXICET) 5-325 MG per tablet Take 1-2 tablets by mouth every 4 (four) hours as needed for moderate pain or severe pain. 07/20/14   Heather Alger Memos, CNM   BP 169/96 mmHg  Pulse 75  Temp(Src)  98.6 F (37 C) (Oral)  Resp 24  Ht 5\' 9"  (1.753 m)  Wt 260 lb (117.935 kg)  BMI 38.38 kg/m2  SpO2 100%  LMP 07/06/2014 (Exact Date) Physical Exam  Constitutional: She is oriented to person, place, and time. She appears well-developed and well-nourished. She appears distressed.  HENT:  Head: Normocephalic and atraumatic.  Right Ear: External ear normal.  Left Ear: External ear normal.  Nose: Nose normal.  Eyes: Right eye exhibits no discharge. Left eye exhibits no discharge.  Cardiovascular: Normal rate.   Pulmonary/Chest: Effort normal.  Abdominal: She exhibits no distension.  Genitourinary: There is tenderness on the right labia.  Large bartholin gland  abscess on the right. No drainage. No cellulitis. Exquisite tenderness  Neurological: She is alert and oriented to person, place, and time.  Skin: Skin is warm and dry.  Nursing note and vitals reviewed.   ED Course  INCISION AND DRAINAGE Date/Time: 07/22/2014 10:53 PM Performed by: Pricilla LovelessGOLDSTON, Xabi Wittler T Authorized by: Pricilla LovelessGOLDSTON, Melaney Tellefsen T Consent: Verbal consent obtained. Risks and benefits: risks, benefits and alternatives were discussed Consent given by: patient Type: abscess Body area: anogenital Location details: Bartholin's gland Anesthesia: local infiltration Local anesthetic: lidocaine 1% without epinephrine Anesthetic total: 3 ml Patient sedated: no Scalpel size: 11 Incision type: single straight Complexity: simple Drainage: purulent Drainage amount: copious Wound treatment: drain placed Patient tolerance: Patient tolerated the procedure well with no immediate complications  Word catheter placed Date/Time: 07/22/2014 10:54 PM Performed by: Pricilla LovelessGOLDSTON, Marsalis Beaulieu T Authorized by: Pricilla LovelessGOLDSTON, Minola Guin T Consent: Verbal consent obtained. Risks and benefits: risks, benefits and alternatives were discussed Consent given by: patient Preparation: Patient was prepped and draped in the usual sterile fashion. Local anesthesia used: yes Anesthesia: local infiltration Local anesthetic: lidocaine 1% without epinephrine Anesthetic total: 3 ml Patient sedated: no Patient tolerance: Patient tolerated the procedure well with no immediate complications   (including critical care time) Labs Review Labs Reviewed - No data to display  Imaging Review No results found.   EKG Interpretation None      MDM   Final diagnoses:  Bartholin's gland abscess    Patient with a large Bartholin gland abscess without evidence of cellulitis. No fevers or signs of sepsis. Given IV pain medicine and then drainage performed as above with a large amount of pus expressed. Word catheter placed as above. No  complications of the procedure at this time. Patient's pain is now significantly improved and she is stable for discharge. Will give percocet for pain as she is out. Given no signs of systemic infection, no indication for antibiotics.     Audree CamelScott T Essense Bousquet, MD 07/22/14 [phone]

## 2014-07-22 NOTE — ED Notes (Signed)
Pt c/o abscess on vagina x's 4 days.  St's they gave her pain meds and told her to come back if it got worse.  Pt st's she went back today but was unable to be seen due to acuity of pt's.  Pt crying in triage due to pain, unable to sit

## 2014-07-22 NOTE — MAU Note (Signed)
Was dx with Bartholin's on Sunday, is doing soaks, not  Getting any better.  Seems to be getting bigger.

## 2014-07-26 LAB — CULTURE, ROUTINE-ABSCESS

## 2014-07-27 ENCOUNTER — Telehealth (HOSPITAL_BASED_OUTPATIENT_CLINIC_OR_DEPARTMENT_OTHER): Payer: Self-pay | Admitting: Emergency Medicine

## 2014-07-27 NOTE — Progress Notes (Signed)
ED Antimicrobial Stewardship Positive Culture Follow Up   Connie SouthwardDawn L [name] is an 34 y.o. female who [facility]/2014 with a chief complaint of vaginal abscess Chief Complaint  Patient presents with  . Abscess    Recent Results (from the past 720 hour(s))  Wet prep, genital     Status: Abnormal   Collection Time: 07/20/14 11:15 PM  Result Value Ref Range Status   Yeast Wet Prep HPF POC NONE SEEN NONE SEEN Final   Trich, Wet Prep NONE SEEN NONE SEEN Final   Clue Cells Wet Prep HPF POC FEW (A) NONE SEEN Final   WBC, Wet Prep HPF POC FEW (A) NONE SEEN Final    Comment: MODERATE BACTERIA SEEN  Culture, routine-abscess     Status: None   Collection Time: 07/22/14 10:52 PM  Result Value Ref Range Status   Specimen Description ABSCESS GLAND  Final   Special Requests BARTHOLINS GLAND CYST  Final   Gram Stain   Final    ABUNDANT WBC PRESENT, PREDOMINANTLY PMN NO SQUAMOUS EPITHELIAL CELLS SEEN MODERATE GRAM NEGATIVE RODS FEW GRAM POSITIVE COCCI IN PAIRS    Culture   Final    FEW PEPTOSTREPTOCOCCUS SPECIES Performed at [facility]    Report Status 07/26/2014 FINAL  Final    [x]  Patient discharged originally without antimicrobial agent and treatment is now indicated  [name] who presented on 2/23 with a vaginal abscess that was drained. The patient did not have systemic signs of infection or cellulitis however a wet prep from 2/21 did show clue cells. No pregnancy test done in the ED. It is reasonable to give Flagyl to cover for BV and also to potentially cover for the peptostreptococcus in the abscess culture.  New antibiotic prescription: Flagyl 500 mg bid for 7 days  ED Provider: Celene Skeenobyn Hess, PA-C  Rolley SimsMartin, [place] Paster Ann 07/27/2014, 3:29 PM Infectious Diseases Pharmacist Phone# [phone]

## 2014-07-28 ENCOUNTER — Telehealth (HOSPITAL_BASED_OUTPATIENT_CLINIC_OR_DEPARTMENT_OTHER): Payer: Self-pay | Admitting: Emergency Medicine

## 2014-07-28 NOTE — Telephone Encounter (Signed)
Post ED Visit - Positive Culture Follow-up: Successful Patient Follow-Up  Culture assessed and recommendations reviewed by: []  Wes Dulaney, Pharm.D., BCPS []  Celedonio MiyamotoJeremy Frens, Pharm.D., BCPS [x]  Georgina PillionElizabeth Martin, Pharm.D., BCPS []  RuthvilleMinh Pham, [address]Pharm[address]D., BCPS, AAHIVP []  Estella HuskMichelle Turner, Pharm.D., BCPS, AAHIVP []  Red ChristiansSamson Lee, Pharm.D. []  Tennis Mustassie Stewart, [place]D.  Positive peptostreptococcus abcess culture  [x]  Patient discharged without antimicrobial prescription and treatment is now indicated []  Organism is resistant to prescribed ED discharge antimicrobial []  Patient with positive blood cultures  Changes discussed with ED provider: Celene [facility]enobyn Hess [facility] New antibiotic prescription Flagyl 500mg  bid x 7 days [facility] [address]  Contacted patient, date 07/28/14 1330   Berle MullMiller, Jessy Cybulski 07/28/2014, 1:34 PM

## 2014-08-01 ENCOUNTER — Encounter: Payer: Self-pay | Admitting: Obstetrics & Gynecology

## 2014-08-01 ENCOUNTER — Ambulatory Visit (INDEPENDENT_AMBULATORY_CARE_PROVIDER_SITE_OTHER): Payer: Medicaid Other | Admitting: Obstetrics & Gynecology

## 2014-08-01 ENCOUNTER — Other Ambulatory Visit (HOSPITAL_COMMUNITY)
Admission: RE | Admit: 2014-08-01 | Discharge: 2014-08-01 | Disposition: A | Discharge status: Home / Self Care | Payer: Medicaid Other | Source: Ambulatory Visit | Attending: Obstetrics & Gynecology | Admitting: Obstetrics & Gynecology

## 2014-08-01 VITALS — BP 146/98 | HR 71 | Temp 98.0°F | Ht 69.5 in | Wt 252.7 lb

## 2014-08-01 DIAGNOSIS — Z0000 Encounter for general adult medical examination without abnormal findings: Secondary | ICD-10-CM

## 2014-08-01 DIAGNOSIS — Z01419 Encounter for gynecological examination (general) (routine) without abnormal findings: Secondary | ICD-10-CM | POA: Diagnosis not present

## 2014-08-01 DIAGNOSIS — Z1151 Encounter for screening for human papillomavirus (HPV): Secondary | ICD-10-CM | POA: Diagnosis not present

## 2014-08-01 DIAGNOSIS — Z124 Encounter for screening for malignant neoplasm of cervix: Secondary | ICD-10-CM

## 2014-08-01 DIAGNOSIS — E669 Obesity, unspecified: Secondary | ICD-10-CM

## 2014-08-01 NOTE — Progress Notes (Signed)
   Subjective:    Patient ID: Connie [name], female    DOB: 04/02/1981, 34 y.o.   MRN: [id number]  HPI  34 yo P3 here for removal of a Word catheter placed in [facility] about 10 days ago for a Bartholin's abscess. She reports that her pap smear was many years ago.  Review of Systems     Objective:   Physical Exam  WNWHBFNAD Ambulating and breathing normally Neuro- normal Abd- benign, obese Word catheter removed from her right labia majora, small amount of pus present. No evidence of cellulitis Pap smear obtained.      Assessment & Plan:  Preventative - pap smear today Bartholin's - RTC prn

## 2014-08-04 LAB — CYTOLOGY - PAP

## 2014-09-19 ENCOUNTER — Emergency Department (HOSPITAL_COMMUNITY)
Admission: EM | Admit: 2014-09-19 | Discharge: 2014-09-19 | Disposition: A | Discharge status: Home / Self Care | Payer: Medicaid Other | Attending: Emergency Medicine | Admitting: Emergency Medicine

## 2014-09-19 ENCOUNTER — Encounter (HOSPITAL_COMMUNITY): Payer: Self-pay | Admitting: Emergency Medicine

## 2014-09-19 DIAGNOSIS — R55 Syncope and collapse: Secondary | ICD-10-CM | POA: Diagnosis not present

## 2014-09-19 DIAGNOSIS — Z880 Allergy status to penicillin: Secondary | ICD-10-CM | POA: Diagnosis not present

## 2014-09-19 DIAGNOSIS — Z8719 Personal history of other diseases of the digestive system: Secondary | ICD-10-CM | POA: Diagnosis not present

## 2014-09-19 DIAGNOSIS — I10 Essential (primary) hypertension: Secondary | ICD-10-CM | POA: Insufficient documentation

## 2014-09-19 DIAGNOSIS — Z91040 Latex allergy status: Secondary | ICD-10-CM | POA: Diagnosis not present

## 2014-09-19 DIAGNOSIS — Z720 Tobacco use: Secondary | ICD-10-CM | POA: Diagnosis not present

## 2014-09-19 DIAGNOSIS — F419 Anxiety disorder, unspecified: Secondary | ICD-10-CM | POA: Diagnosis not present

## 2014-09-19 DIAGNOSIS — J45901 Unspecified asthma with (acute) exacerbation: Secondary | ICD-10-CM | POA: Diagnosis not present

## 2014-09-19 DIAGNOSIS — R251 Tremor, unspecified: Secondary | ICD-10-CM | POA: Diagnosis present

## 2014-09-19 DIAGNOSIS — Z79899 Other long term (current) drug therapy: Secondary | ICD-10-CM | POA: Insufficient documentation

## 2014-09-19 DIAGNOSIS — Z8619 Personal history of other infectious and parasitic diseases: Secondary | ICD-10-CM | POA: Insufficient documentation

## 2014-09-19 DIAGNOSIS — J9801 Acute bronchospasm: Secondary | ICD-10-CM

## 2014-09-19 LAB — CBG MONITORING, ED: GLUCOSE-CAPILLARY: 106 mg/dL — AB (ref 70–99)

## 2014-09-19 MED ORDER — ALBUTEROL SULFATE HFA 108 (90 BASE) MCG/ACT IN AERS
2.0000 | INHALATION_SPRAY | RESPIRATORY_TRACT | Status: DC
  Administered 2014-09-19: 2 via RESPIRATORY_TRACT
  Filled 2014-09-19: qty 6.7

## 2014-09-19 MED ORDER — PREDNISONE 20 MG PO TABS
60.0000 mg | ORAL_TABLET | Freq: Once | ORAL | Status: AC
  Administered 2014-09-19: 60 mg via ORAL
  Filled 2014-09-19: qty 3

## 2014-09-19 MED ORDER — IPRATROPIUM-ALBUTEROL 0.5-2.5 (3) MG/3ML IN SOLN
3.0000 mL | Freq: Once | RESPIRATORY_TRACT | Status: AC
  Administered 2014-09-19: 3 mL via RESPIRATORY_TRACT
  Filled 2014-09-19: qty 3

## 2014-09-19 MED ORDER — PREDNISONE 20 MG PO TABS: 40.0000 mg | ORAL_TABLET | Freq: Every day | ORAL | Status: DC

## 2014-09-19 MED ORDER — LORAZEPAM 1 MG PO TABS
2.0000 mg | ORAL_TABLET | Freq: Once | ORAL | Status: AC
  Administered 2014-09-19: 2 mg via ORAL
  Filled 2014-09-19: qty 2

## 2014-09-19 MED ORDER — ALBUTEROL SULFATE (2.5 MG/3ML) 0.083% IN NEBU
5.0000 mg | INHALATION_SOLUTION | Freq: Once | RESPIRATORY_TRACT | Status: AC
  Administered 2014-09-19: 5 mg via RESPIRATORY_TRACT
  Filled 2014-09-19: qty 6

## 2014-09-19 NOTE — Discharge Instructions (Signed)
Bronchospasm °A bronchospasm is a spasm or tightening of the airways going into the lungs. During a bronchospasm breathing becomes more difficult because the airways get smaller. When this happens there can be coughing, a whistling sound when breathing (wheezing), and difficulty breathing. Bronchospasm is often associated with asthma, but not all patients who experience a bronchospasm have asthma. °CAUSES  °A bronchospasm is caused by inflammation or irritation of the airways. The inflammation or irritation may be triggered by:  °· Allergies (such as to animals, pollen, food, or mold). Allergens that cause bronchospasm may cause wheezing immediately after exposure or many hours later.   °· Infection. Viral infections are believed to be the most common cause of bronchospasm.   °· Exercise.   °· Irritants (such as pollution, cigarette smoke, strong odors, aerosol sprays, and paint fumes).   °· Weather changes. Winds increase molds and pollens in the air. Rain refreshes the air by washing irritants out. Cold air may cause inflammation.   °· Stress and emotional upset.   °SIGNS AND SYMPTOMS  °· Wheezing.   °· Excessive nighttime coughing.   °· Frequent or severe coughing with a simple cold.   °· Chest tightness.   °· Shortness of breath.   °DIAGNOSIS  °Bronchospasm is usually diagnosed through a history and physical exam. Tests, such as chest X-rays, are sometimes done to look for other conditions. °TREATMENT  °· Inhaled medicines can be given to open up your airways and help you breathe. The medicines can be given using either an inhaler or a nebulizer machine. °· Corticosteroid medicines may be given for severe bronchospasm, usually when it is associated with asthma. °HOME CARE INSTRUCTIONS  °· Always have a plan prepared for seeking medical care. Know when to call your health care provider and local emergency services (911 in the [place] Know where you can access local emergency care. °· Only take medicines as  directed by your health care provider. °· If you were prescribed an inhaler or nebulizer machine, ask your health care provider to explain how to use it correctly. Always use a spacer with your inhaler if you were given one. °· It is necessary to remain calm during an attack. Try to relax and breathe more slowly.  °· Control your home environment in the following ways:   °¨ Change your heating and air conditioning filter at least once a month.   °¨ Limit your use of fireplaces and wood stoves. °¨ Do not smoke and do not allow smoking in your home.   °¨ Avoid exposure to perfumes and fragrances.   °¨ Get rid of pests (such as roaches and mice) and their droppings.   °¨ Throw away plants if you see mold on them.   °¨ Keep your house clean and dust free.   °¨ Replace carpet with wood, tile, or vinyl flooring. Carpet can trap dander and dust.   °¨ Use allergy-proof pillows, mattress covers, and box spring covers.   °¨ Wash bed sheets and blankets every week in hot water and dry them in a dryer.   °¨ Use blankets that are made of polyester or cotton.   °¨ Wash hands frequently. °SEEK MEDICAL CARE IF:  °· You have muscle aches.   °· You have chest pain.   °· The sputum changes from clear or white to yellow, green, gray, or bloody.   °· The sputum you cough up gets thicker.   °· There are problems that may be related to the medicine you are given, such as a rash, itching, swelling, or trouble breathing.   °SEEK IMMEDIATE MEDICAL CARE IF:  °· You have worsening wheezing and coughing even   after taking your prescribed medicines.   °· You have increased difficulty breathing.   °· You develop severe chest pain. °MAKE SURE YOU:  °· Understand these instructions. °· Will watch your condition. °· Will get help right away if you are not doing well or get worse. °Document Released: 05/19/2003 Document Revised: 05/21/2013 Document Reviewed: 11/05/2012 °ExitCare® Patient Information ©2015 [employer]. This information is not  intended to replace advice given to you by your health care provider. Make sure you discuss any questions you have with your health care provider. ° °

## 2014-09-19 NOTE — ED Notes (Signed)
Bed: WA01 Expected date:  Expected time:  Means of arrival:  Comments: Hold for tr 1

## 2014-09-19 NOTE — ED Provider Notes (Signed)
CSN: [id number]     Arrival date & time 09/19/14  2129 History   First MD Initiated Contact with Patient 09/19/14 2203     Chief Complaint  Patient presents with  . Near Syncope  . Tremors  . Asthma     (Consider location/radiation/quality/duration/timing/severity/associated sxs/prior Treatment) HPI Comments: Patient here after developing acute onset of dyspnea and wheezing after she went outside to smoke a cigarette. Has a known history of asthma continues to use tobacco products. Became very anxious as well 2. Begin to hyperventilate. Do not have her inhaler present. Has some numbness in her arm but denies any severe headache or ataxia. Feels better at this time with exception of being short of breath and anxious. Was at her baseline up until the point when she smoked  Patient is a 34 y.o. female presenting with near-syncope and asthma. The history is provided by the patient.  Near Syncope  Asthma    Past Medical History  Diagnosis Date  . Hypertension   . Asthma   . GERD (gastroesophageal reflux disease)   . Headache(784.0)   . History of gonorrhea   . History of chlamydia   . Pregnancy induced hypertension     previous pregnancy  . Increased BMI   . Varicose veins    Past Surgical History  Procedure Laterality Date  . Tonsillectomy    . Cholecystectomy, laparoscopic    . Adenoidectomy    . Breast cyst excision  2008  . Tubal ligation  04/02/2011    Procedure: POST PARTUM TUBAL LIGATION;  Surgeon: Janine LimboArthur V Stringer, MD;  Location: [facility];  Service: Gynecology;  Laterality: Bilateral;  Bilateral post partum tubal ligation   Family History  Problem Relation Age of Onset  . Heart attack Maternal Uncle   . Drug abuse Maternal Uncle     crack addiction  . Heart attack Maternal Grandmother   . Peripheral vascular disease Maternal Grandmother   . Depression Maternal Grandmother   . Mental retardation Maternal Grandmother     OCD  . Cancer Paternal Grandmother     lung   . Anesthesia problems Neg Hx   . Hypotension Neg Hx   . Malignant hyperthermia Neg Hx   . Pseudochol deficiency Neg Hx    History  Substance Use Topics  . Smoking status: Current Every Day Smoker -- 0.25 packs/day for 20 years    Types: Cigarettes  . Smokeless tobacco: Current User  . Alcohol Use: Yes   OB History    Gravida Para Term Preterm AB TAB SAB Ectopic Multiple Living   4 3 2  1 1    3      Review of Systems  Cardiovascular: Positive for near-syncope.  All other systems reviewed and are negative.     Allergies  Dilaudid; Amoxicillin; and Latex  Home Medications   Prior to Admission medications   Medication Sig Start Date End Date Taking? Authorizing Provider  albuterol (PROVENTIL HFA;VENTOLIN HFA) 108 (90 BASE) MCG/ACT inhaler Inhale 2 puffs into the lungs every 6 (six) hours as needed. Shortness of breath    Historical Provider, MD  ibuprofen (ADVIL,MOTRIN) 200 MG tablet Take 200 mg by mouth every 6 (six) hours as needed for moderate pain.     Historical Provider, MD   BP 164/95 mmHg  Pulse 98  Temp(Src) 97.9 F (36.6 C) (Oral)  Resp 17  SpO2 100% Physical Exam  Constitutional: She is oriented to person, place, and time. She appears well-developed and well-nourished.  Non-toxic appearance. No distress.  HENT:  Head: Normocephalic and atraumatic.  Eyes: Conjunctivae, EOM and lids are normal. Pupils are equal, round, and reactive to light.  Neck: Normal range of motion. Neck supple. No tracheal deviation present. No thyroid mass present.  Cardiovascular: Normal rate, regular rhythm and normal heart sounds.  Exam reveals no gallop.   No murmur heard. Pulmonary/Chest: Effort normal. No stridor. No respiratory distress. She has decreased breath sounds. She has wheezes. She has no rhonchi. She has no rales.  Abdominal: Soft. Normal appearance and bowel sounds are normal. She exhibits no distension. There is no tenderness. There is no rebound and no CVA  tenderness.  Musculoskeletal: Normal range of motion. She exhibits no edema or tenderness.  Neurological: She is alert and oriented to person, place, and time. She has normal strength. No cranial nerve deficit or sensory deficit. GCS eye subscore is 4. GCS verbal subscore is 5. GCS motor subscore is 6.  Skin: Skin is warm and dry. No abrasion and no rash noted.  Psychiatric: Her speech is normal and behavior is normal. Her mood appears anxious.  Nursing note and vitals reviewed.   ED Course  Procedures (including critical care time) Labs Review Labs Reviewed  CBG MONITORING, ED - Abnormal; Notable for the following:    Glucose-Capillary 106 (*)    All other components within normal limits    Imaging Review No results found.   EKG Interpretation None      MDM   Final diagnoses:  None    Patient given albuterol along with Ativan and feels better. Lungs were examined and are improved. Begin prescription for prednisone and albuterol inhaler to go home with.    Lorre Nick, MD 09/19/14 2258

## 2014-09-19 NOTE — ED Notes (Addendum)
Pt reports being at work, having a rush of people and then went outside for fresh air. Started trembling and feeling lightheaded. No LOC. Denies chest pain. Has numbness in left arm. Visualized moving left arm. Hx HTN. Denies hx CVA, MI. Hx asthma. Smoked cigarettes tonight. Pt is hyperventilating. Denies hx anxiety.

## 2016-03-27 ENCOUNTER — Encounter (HOSPITAL_COMMUNITY): Payer: Self-pay | Admitting: Emergency Medicine

## 2016-03-27 ENCOUNTER — Emergency Department (HOSPITAL_COMMUNITY)
Admission: EM | Admit: 2016-03-27 | Discharge: 2016-03-28 | Disposition: A | Discharge status: Home / Self Care | Payer: Self-pay | Attending: Emergency Medicine | Admitting: Emergency Medicine

## 2016-03-27 DIAGNOSIS — I10 Essential (primary) hypertension: Secondary | ICD-10-CM | POA: Insufficient documentation

## 2016-03-27 DIAGNOSIS — Y990 Civilian activity done for income or pay: Secondary | ICD-10-CM | POA: Insufficient documentation

## 2016-03-27 DIAGNOSIS — Y929 Unspecified place or not applicable: Secondary | ICD-10-CM | POA: Insufficient documentation

## 2016-03-27 DIAGNOSIS — M62838 Other muscle spasm: Secondary | ICD-10-CM | POA: Insufficient documentation

## 2016-03-27 DIAGNOSIS — Z91040 Latex allergy status: Secondary | ICD-10-CM | POA: Insufficient documentation

## 2016-03-27 DIAGNOSIS — X500XXA Overexertion from strenuous movement or load, initial encounter: Secondary | ICD-10-CM | POA: Insufficient documentation

## 2016-03-27 DIAGNOSIS — Y939 Activity, unspecified: Secondary | ICD-10-CM | POA: Insufficient documentation

## 2016-03-27 DIAGNOSIS — J45909 Unspecified asthma, uncomplicated: Secondary | ICD-10-CM | POA: Insufficient documentation

## 2016-03-27 DIAGNOSIS — F17210 Nicotine dependence, cigarettes, uncomplicated: Secondary | ICD-10-CM | POA: Insufficient documentation

## 2016-03-27 LAB — BASIC METABOLIC PANEL
ANION GAP: 6 (ref 5–15)
BUN: 18 mg/dL (ref 6–20)
CALCIUM: 8.9 mg/dL (ref 8.9–10.3)
CO2: 22 mmol/L (ref 22–32)
Chloride: 106 mmol/L (ref 101–111)
Creatinine, Ser: 0.75 mg/dL (ref 0.44–1.00)
GFR calc Af Amer: >60 mL/min (ref >60)
Glucose, Bld: 99 mg/dL (ref 65–99)
Potassium: 3.8 mmol/L (ref 3.5–5.1)
SODIUM: 134 mmol/L — AB (ref 135–145)

## 2016-03-27 LAB — CBC
HCT: 30.6 % — ABNORMAL LOW (ref 36.0–46.0)
Hemoglobin: 10.1 g/dL — ABNORMAL LOW (ref 12.0–15.0)
MCH: 27.4 pg (ref 26.0–34.0)
MCHC: 33 g/dL (ref 30.0–36.0)
MCV: 83.2 fL (ref 78.0–100.0)
PLATELETS: 276 10*3/uL (ref 150–400)
RBC: 3.68 MIL/uL — ABNORMAL LOW (ref 3.87–5.11)
RDW: 15.3 % (ref 11.5–15.5)
WBC: 6 10*3/uL (ref 4.0–10.5)

## 2016-03-27 NOTE — ED Triage Notes (Signed)
Pt report R sided neck pain and shoulder pain worsening upon waking this morning, went to work anyway and got worse. States ibuprofen not effective. LMP 10/12

## 2016-03-28 ENCOUNTER — Encounter (HOSPITAL_COMMUNITY): Payer: Self-pay | Admitting: Emergency Medicine

## 2016-03-28 ENCOUNTER — Emergency Department (HOSPITAL_COMMUNITY): Payer: Self-pay

## 2016-03-28 MED ORDER — DEXAMETHASONE SODIUM PHOSPHATE 10 MG/ML IJ SOLN
10.0000 mg | Freq: Once | INTRAMUSCULAR | Status: AC
  Administered 2016-03-28: 10 mg via INTRAMUSCULAR
  Filled 2016-03-28: qty 1

## 2016-03-28 MED ORDER — METHOCARBAMOL 500 MG PO TABS
1000.0000 mg | ORAL_TABLET | Freq: Once | ORAL | Status: AC
  Administered 2016-03-28: 1000 mg via ORAL
  Filled 2016-03-28: qty 2

## 2016-03-28 MED ORDER — KETOROLAC TROMETHAMINE 60 MG/2ML IM SOLN
60.0000 mg | Freq: Once | INTRAMUSCULAR | Status: AC
  Administered 2016-03-28: 60 mg via INTRAMUSCULAR
  Filled 2016-03-28: qty 2

## 2016-03-28 MED ORDER — METHOCARBAMOL 500 MG PO TABS: 500.0000 mg | ORAL_TABLET | Freq: Two times a day (BID) | ORAL | 0 refills | Status: DC

## 2016-03-28 MED ORDER — DICLOFENAC SODIUM ER 100 MG PO TB24: 100.0000 mg | ORAL_TABLET | Freq: Every day | ORAL | 0 refills | Status: DC

## 2016-03-28 NOTE — ED Notes (Signed)
Pt understood dc material. NAD noted. Scripts given at dc 

## 2016-03-28 NOTE — ED Notes (Signed)
MD made aware of patients status regarding medication effectiveness

## 2016-03-28 NOTE — ED Provider Notes (Signed)
[facility]-EMERGENCY DEPT Provider Note   CSN: [id number] Arrival date & time: 03/27/16  2147  By signing my name below, I, Suzan SlickAshley N. Elon SpannerLeger, attest that this documentation has been prepared under the direction and in the presence of Anwyn Kriegel, MD.  Electronically Signed: Suzan SlickAshley N. Elon SpannerLeger, ED Scribe. 03/28/16. 2:30 AM.    History   Chief Complaint Chief Complaint  Patient presents with  . Neck Pain  . Shoulder Pain   The history is provided by the patient. No language interpreter was used.  Shoulder Pain   This is a new problem. The current episode started 12 to 24 hours ago. The problem has not changed since onset.The pain is present in the neck and right shoulder. The pain is moderate. Pertinent negatives include no numbness and full range of motion. She has tried OTC pain medications for the symptoms. The treatment provided no relief. There has been no history of extremity trauma. Family history is significant for no rheumatoid arthritis and no gout.  No CP, SOB, n/v/d  HPI Comments: Connie [name] is a 35 y.o. female without any pertinent past medical history who presents to the Emergency Department complaining of constant, unchanged R sided shoulder pain and neck pain x 24 hours that woke her from sleep. Pt admits she does a lot of lifting while at work up to 35 pounds. However, she denies any recent injury or trauma. No aggravating or alleviating factors reported. OTC Ibuprofen attempted prior to arrival. No recent fever, chills, nausea, or vomiting. No numbness, loss of sensation, or weakness.  PCP: Default, Provider, MD    Past Medical History:  Diagnosis Date  . Asthma   . GERD (gastroesophageal reflux disease)   . Headache(784.0)   . History of chlamydia   . History of gonorrhea   . Hypertension   . Increased BMI   . Pregnancy induced hypertension    previous pregnancy  . Varicose veins     Patient Active Problem List   Diagnosis Date Noted  . Obesity 08/01/2014  .  Chronic hypertension in pregnancy 04/04/2011  . S/P tubal ligation 04/04/2011  . Anemia 04/04/2011    Past Surgical History:  Procedure Laterality Date  . ADENOIDECTOMY    . BREAST CYST EXCISION  2008  . CHOLECYSTECTOMY, LAPAROSCOPIC    . TONSILLECTOMY    . TUBAL LIGATION  04/02/2011   Procedure: POST PARTUM TUBAL LIGATION;  Surgeon: Janine LimboArthur V Stringer, MD;  Location: [facility];  Service: Gynecology;  Laterality: Bilateral;  Bilateral post partum tubal ligation    OB History    Gravida Para Term Preterm AB Living   4 3 2   1 3    SAB TAB Ectopic Multiple Live Births     1     3       Home Medications    Prior to Admission medications   Medication Sig Start Date End Date Taking? Authorizing Provider  albuterol (PROVENTIL HFA;VENTOLIN HFA) 108 (90 BASE) MCG/ACT inhaler Inhale 2 puffs into the lungs every 6 (six) hours as needed. Shortness of breath    Historical Provider, MD  ibuprofen (ADVIL,MOTRIN) 200 MG tablet Take 200 mg by mouth every 6 (six) hours as needed for moderate pain.     Historical Provider, MD  predniSONE (DELTASONE) 20 MG tablet Take 2 tablets (40 mg total) by mouth daily. 09/19/14   Lorre NickAnthony Allen, MD  TRAMADOL HCL PO Take 1 tablet by mouth once as needed (pain.).    Historical Provider, MD  Family History Family History  Problem Relation Age of Onset  . Heart attack Maternal Uncle   . Drug abuse Maternal Uncle     crack addiction  . Heart attack Maternal Grandmother   . Peripheral vascular disease Maternal Grandmother   . Depression Maternal Grandmother   . Mental retardation Maternal Grandmother     OCD  . Cancer Paternal Grandmother     lung  . Anesthesia problems Neg Hx   . Hypotension Neg Hx   . Malignant hyperthermia Neg Hx   . Pseudochol deficiency Neg Hx     Social History Social History  Substance Use Topics  . Smoking status: Current Every Day Smoker    Packs/day: 0.50    Years: 20.00    Types: Cigarettes  . Smokeless tobacco: Current  User  . Alcohol use Yes     Allergies   Dilaudid [hydromorphone hcl]; Amoxicillin; and Latex   Review of Systems Review of Systems  Constitutional: Negative for chills and fever.  Respiratory: Negative for shortness of breath.   Cardiovascular: Negative for chest pain.  Gastrointestinal: Negative for nausea and vomiting.  Musculoskeletal: Positive for arthralgias and neck pain.  Neurological: Negative for weakness and numbness.  All other systems reviewed and are negative.    Physical Exam Updated Vital Signs BP (!) 198/128 (BP Location: Left Arm)   Pulse 81   Temp 98.2 F (36.8 C) (Oral)   Resp 16   Ht 5' 9.5" (1.765 m)   Wt 264 lb (119.7 kg)   LMP 03/10/2016 (Exact Date)   SpO2 100%   BMI 38.43 kg/m   Physical Exam  Constitutional: She is oriented to person, place, and time. She appears well-developed and well-nourished. No distress.  HENT:  Head: Normocephalic and atraumatic.  Mouth/Throat: Oropharynx is clear and moist.  Eyes: EOM are normal. Pupils are equal, round, and reactive to light.  Neck: Normal range of motion.  Trachea is midline. No carotid bruits.  Cardiovascular: Normal rate, regular rhythm, normal heart sounds and intact distal pulses.   Pulses:      Radial pulses are 3+ on the right side.  Capillary refill less than 3 seconds to all digits.  Pulmonary/Chest: Effort normal and breath sounds normal. No stridor. No respiratory distress. She has no wheezes. She has no rales.  Abdominal: Soft. She exhibits no distension. There is no tenderness.  Musculoskeletal: Normal range of motion.  No step offs or crepitus of the C, T, L, or S spine. Normal alignment. No curvature of the spine. No winging of the scapula. Negative neers test. Musce spasm noted to the R trapezius.   Lymphadenopathy:    She has no cervical adenopathy.  Neurological: She is alert and oriented to person, place, and time. She has normal reflexes.  Reflex Scores:      Tricep reflexes  are 2+ on the right side.      Bicep reflexes are 2+ on the right side. 5/5 strength noted to R upper extremity.  Skin: Skin is warm and dry. Capillary refill takes less than 2 seconds.  Psychiatric: She has a normal mood and affect. Judgment normal.  Nursing note and vitals reviewed.    ED Treatments / Results   Vitals:   03/28/16 0400 03/28/16 0415  BP: (!) 160/110 (!) 147/102  Pulse: 66 63  Resp:    Temp:     Results for orders placed or performed during the hospital encounter of 03/27/16  CBC  Result Value Ref Range  WBC 6.0 4.0 - 10.5 K/uL   RBC 3.68 (L) 3.87 - 5.11 MIL/uL   Hemoglobin 10.1 (L) 12.0 - 15.0 g/dL   HCT 08.630.6 (L) 57.836.0 - 46.946.0 %   MCV 83.2 78.0 - 100.0 fL   MCH 27.4 26.0 - 34.0 pg   MCHC 33.0 30.0 - 36.0 g/dL   RDW 62.915.3 52.811.5 - 41.315.5 %   Platelets 276 150 - 400 K/uL  Basic metabolic panel  Result Value Ref Range   Sodium 134 (L) 135 - 145 mmol/L   Potassium 3.8 3.5 - 5.1 mmol/L   Chloride 106 101 - 111 mmol/L   CO2 22 22 - 32 mmol/L   Glucose, Bld 99 65 - 99 mg/dL   BUN 18 6 - 20 mg/dL   Creatinine, Ser 2.440.75 0.44 - 1.00 mg/dL   Calcium 8.9 8.9 - 01.010.3 mg/dL   GFR calc non Af Amer >60 >60 mL/min   GFR calc Af Amer >60 >60 mL/min   Anion gap 6 5 - 15   Dg Shoulder Right  Result Date: 03/28/2016 CLINICAL DATA:  Acute onset of right shoulder pain. Initial encounter. EXAM: RIGHT SHOULDER - 2+ VIEW COMPARISON:  None. FINDINGS: There is no evidence of fracture or dislocation. The right humeral head is seated within the glenoid fossa. The acromioclavicular joint is unremarkable in appearance. No significant soft tissue abnormalities are seen. The visualized portions of the right lung are clear. IMPRESSION: No evidence of fracture or dislocation. Electronically Signed   By: Roanna RaiderJeffery  Chang M.D.   On: 03/28/2016 02:57     COORDINATION OF CARE: 2:26 AM- Will order blood work and imaging. Discussed treatment plan with pt at bedside and pt agreed to plan.       Procedures Procedures (including critical care time)   Final Clinical Impressions(s) / ED Diagnoses  Muscle spasm: All questions answered to patient's parents satisfaction. Based on history and exam patient has been appropriately medically screened and emergency conditions excluded. Patient is stable for discharge at this time. Follow up with your PMD for recheck in 2 days and strict return precautions given. Must return for fevers, or swelling or infection  New Prescriptions New Prescriptions   No medications on file   I personally performed the services described in this documentation, which was scribed in my presence. The recorded information has been reviewed and is accurate.       Cy BlamerApril Rodnisha Blomgren, MD 03/28/16 [phone]

## 2016-03-28 NOTE — ED Notes (Signed)
Pt frustrated about wait time for MD. RN tried to explain the delay and pt did not want to hear it. RN said ok and left patient's room

## 2019-07-16 DIAGNOSIS — F17210 Nicotine dependence, cigarettes, uncomplicated: Secondary | ICD-10-CM | POA: Diagnosis not present

## 2019-07-16 DIAGNOSIS — Z0000 Encounter for general adult medical examination without abnormal findings: Secondary | ICD-10-CM | POA: Diagnosis not present

## 2019-07-16 DIAGNOSIS — I10 Essential (primary) hypertension: Secondary | ICD-10-CM | POA: Diagnosis not present

## 2019-07-16 DIAGNOSIS — J4520 Mild intermittent asthma, uncomplicated: Secondary | ICD-10-CM | POA: Diagnosis not present

## 2019-07-16 DIAGNOSIS — Z716 Tobacco abuse counseling: Secondary | ICD-10-CM | POA: Diagnosis not present

## 2019-07-16 DIAGNOSIS — R7303 Prediabetes: Secondary | ICD-10-CM | POA: Diagnosis not present

## 2019-07-17 DIAGNOSIS — I10 Essential (primary) hypertension: Secondary | ICD-10-CM | POA: Diagnosis not present

## 2019-07-17 DIAGNOSIS — R7303 Prediabetes: Secondary | ICD-10-CM | POA: Diagnosis not present

## 2019-08-08 DIAGNOSIS — Z124 Encounter for screening for malignant neoplasm of cervix: Secondary | ICD-10-CM | POA: Diagnosis not present

## 2019-08-08 DIAGNOSIS — J4520 Mild intermittent asthma, uncomplicated: Secondary | ICD-10-CM | POA: Diagnosis not present

## 2019-08-08 DIAGNOSIS — R252 Cramp and spasm: Secondary | ICD-10-CM | POA: Diagnosis not present

## 2019-08-08 DIAGNOSIS — I10 Essential (primary) hypertension: Secondary | ICD-10-CM | POA: Diagnosis not present

## 2019-08-28 DIAGNOSIS — Z716 Tobacco abuse counseling: Secondary | ICD-10-CM | POA: Diagnosis not present

## 2019-08-28 DIAGNOSIS — D649 Anemia, unspecified: Secondary | ICD-10-CM | POA: Diagnosis not present

## 2019-08-28 DIAGNOSIS — F17210 Nicotine dependence, cigarettes, uncomplicated: Secondary | ICD-10-CM | POA: Diagnosis not present

## 2019-08-28 DIAGNOSIS — E669 Obesity, unspecified: Secondary | ICD-10-CM | POA: Diagnosis not present

## 2019-08-28 DIAGNOSIS — I10 Essential (primary) hypertension: Secondary | ICD-10-CM | POA: Diagnosis not present

## 2019-08-28 DIAGNOSIS — R7303 Prediabetes: Secondary | ICD-10-CM | POA: Diagnosis not present

## 2019-08-28 DIAGNOSIS — Z6841 Body Mass Index (BMI) 40.0 and over, adult: Secondary | ICD-10-CM | POA: Diagnosis not present

## 2019-11-04 DIAGNOSIS — F17210 Nicotine dependence, cigarettes, uncomplicated: Secondary | ICD-10-CM | POA: Diagnosis not present

## 2019-11-04 DIAGNOSIS — J4520 Mild intermittent asthma, uncomplicated: Secondary | ICD-10-CM | POA: Diagnosis not present

## 2019-11-04 DIAGNOSIS — I10 Essential (primary) hypertension: Secondary | ICD-10-CM | POA: Diagnosis not present

## 2019-11-12 DIAGNOSIS — I10 Essential (primary) hypertension: Secondary | ICD-10-CM | POA: Diagnosis not present

## 2019-11-12 DIAGNOSIS — J4520 Mild intermittent asthma, uncomplicated: Secondary | ICD-10-CM | POA: Diagnosis not present

## 2019-11-15 DIAGNOSIS — J4520 Mild intermittent asthma, uncomplicated: Secondary | ICD-10-CM | POA: Diagnosis not present

## 2019-11-25 DIAGNOSIS — R7303 Prediabetes: Secondary | ICD-10-CM | POA: Diagnosis not present

## 2019-11-25 DIAGNOSIS — J4520 Mild intermittent asthma, uncomplicated: Secondary | ICD-10-CM | POA: Diagnosis not present

## 2019-11-25 DIAGNOSIS — I10 Essential (primary) hypertension: Secondary | ICD-10-CM | POA: Diagnosis not present

## 2019-11-25 DIAGNOSIS — E669 Obesity, unspecified: Secondary | ICD-10-CM | POA: Diagnosis not present

## 2019-11-28 DIAGNOSIS — Z419 Encounter for procedure for purposes other than remedying health state, unspecified: Secondary | ICD-10-CM | POA: Diagnosis not present

## 2019-12-29 DIAGNOSIS — Z419 Encounter for procedure for purposes other than remedying health state, unspecified: Secondary | ICD-10-CM | POA: Diagnosis not present

## 2020-01-29 DIAGNOSIS — Z419 Encounter for procedure for purposes other than remedying health state, unspecified: Secondary | ICD-10-CM | POA: Diagnosis not present

## 2020-02-26 DIAGNOSIS — J4520 Mild intermittent asthma, uncomplicated: Secondary | ICD-10-CM | POA: Diagnosis not present

## 2020-02-26 DIAGNOSIS — K649 Unspecified hemorrhoids: Secondary | ICD-10-CM | POA: Diagnosis not present

## 2020-02-26 DIAGNOSIS — R7303 Prediabetes: Secondary | ICD-10-CM | POA: Diagnosis not present

## 2020-02-26 DIAGNOSIS — Z7189 Other specified counseling: Secondary | ICD-10-CM | POA: Diagnosis not present

## 2020-02-26 DIAGNOSIS — I10 Essential (primary) hypertension: Secondary | ICD-10-CM | POA: Diagnosis not present

## 2020-02-28 DIAGNOSIS — Z419 Encounter for procedure for purposes other than remedying health state, unspecified: Secondary | ICD-10-CM | POA: Diagnosis not present

## 2020-03-23 ENCOUNTER — Ambulatory Visit: Payer: Self-pay | Admitting: Surgery

## 2020-03-23 DIAGNOSIS — K641 Second degree hemorrhoids: Secondary | ICD-10-CM | POA: Diagnosis not present

## 2020-03-23 DIAGNOSIS — R12 Heartburn: Secondary | ICD-10-CM | POA: Diagnosis not present

## 2020-03-23 DIAGNOSIS — K644 Residual hemorrhoidal skin tags: Secondary | ICD-10-CM | POA: Diagnosis not present

## 2020-03-23 NOTE — H&P (Signed)
Connie [name] Appointment: 03/23/2020 8:45 AM Location: [facility] Patient #: [id number] DOB: 1980/07/29 Single / Language: [place] / Race: Black or African American Female  History of Present Illness Connie [name](Connie Brackin C. Connie Youkhana MD; 03/23/2020 9:11 AM) The patient is a 39 year old female who presents with hemorrhoids. Note for "Hemorrhoids": ` ` ` Patient sent for surgical consultation at the request of Dr Concepcion ElkAvbuere  Chief Complaint: Hemorrhoids ` ` The patient is a pleasant woman that struggle with hemorrhoids since her last pregnancy. Feel something on the outside. Some irritation discomfort. Occasional blood. He moves her bowels once a day but occasionally could be constipated up to 3 days. Uses MiraLAX intermittently. She has not required any incision and drainage or anorectal surgeries or bandings. She's tried some over-the-counter creams with only mild relief. Given prescription suppositories without much help. Patient's concerns surgical consultation offered. Has history of heartburn and reflux with some intermittent epigastric discomfort. Usually over-the-counter baking soda works better than Tums. Does not have a gastroenterologist. Has never had prior endoscopy. She does smoke but is trying to quit. No diabetes. No cardiac or pulmonary issues. Can walk half hour without difficulty. She had laparoscopic cholecystectomy 2006 by Dr Ezzard StandingNewman with our group as well as tubal ligation. No other abdominal surgeries. She did require incision and drainage of breast abscess in 2008 by Dr. Johna SheriffHoxworth with our group. His had Bartholin's cyst abscesses and other skin abscesses drained. No chronic hidradenitis.  No personal nor family history of GI/colon cancer, inflammatory bowel disease, irritable bowel syndrome, allergy such as Celiac Sprue, dietary/dairy problems, colitis, ulcers nor gastritis. No recent sick contacts/gastroenteritis. No travel outside the country. No changes in  diet. No dysphagia to solids or liquids. No melena, hematemesis, coffee ground emesis. No evidence of prior gastric/peptic ulceration.  (Review of systems as stated in this history (HPI) or in the review of systems. Otherwise all other 12 point ROS are negative) ` ` ###########################################`  This patient encounter took 30 minutes today to perform the following: obtain history, perform exam, review outside records, interpret tests & imaging, counsel the patient on their diagnosis; and, document this encounter, including findings & plan in the electronic health record (EHR).   Past Surgical History Laurette Schimke(Lisa Caldwell, [place]; 03/23/2020 8:32 AM) Breast Biopsy Left. Gallbladder Surgery - Laparoscopic Tonsillectomy  Diagnostic Studies History Laurette Schimke(Lisa Caldwell, [place]; 03/23/2020 8:32 AM) Colonoscopy never Mammogram >3 years ago Pap Smear >5 years ago  Allergies Laurette Schimke(Lisa Caldwell, RMA; 03/23/2020 8:34 AM) Penicillin G Pot in Dextrose *PENICILLINS* Amoxicillin *PENICILLINS* Dilaudid *ANALGESICS - OPIOID* Allergies Reconciled  Medication History Laurette Schimke(Lisa Caldwell, RMA; 03/23/2020 8:34 AM) Albuterol Sulfate ((2.5 MG/3ML)0.083% Nebulized Soln, Inhalation) Active. hydroCHLOROthiazide (25MG  Tablet, Oral) Active. Ibuprofen (Oral) Specific strength unknown - Active. Medications Reconciled  Social History Laurette Schimke(Lisa Caldwell, [place]; 03/23/2020 8:32 AM) Caffeine use Carbonated beverages. No drug use  Family History Laurette Schimke(Lisa Caldwell, [place]; 03/23/2020 8:32 AM) Arthritis Mother. Hypertension Father. Respiratory Condition Father.  Pregnancy / Birth History Laurette Schimke(Lisa Caldwell, [place]; 03/23/2020 8:32 AM) Age at menarche 12 years. Gravida 4 Length (months) of breastfeeding 3-6 Maternal age 41-20 Para 3 Regular periods  Other Problems Laurette Schimke(Lisa Caldwell, [place]; 03/23/2020 8:32 AM) Arthritis Asthma Back Pain Cholelithiasis Gastroesophageal Reflux  Disease Hemorrhoids High blood pressure Transfusion history     Review of Systems Laurette Schimke(Lisa Caldwell RMA; 03/23/2020 8:32 AM) General Not Present- Appetite Loss, Chills, Fatigue, Fever, Night Sweats, Weight Gain and Weight Loss. Skin Not Present- Change in Wart/Mole, Dryness, Hives, Jaundice, New Lesions, Non-Healing Wounds, Rash and  Ulcer. HEENT Present- Seasonal Allergies and Wears glasses/contact lenses. Not Present- Earache, Hearing Loss, Hoarseness, Nose Bleed, Oral Ulcers, Ringing in the Ears, Sinus Pain, Sore Throat, Visual Disturbances and Yellow Eyes. Cardiovascular Present- Leg Cramps. Not Present- Chest Pain, Difficulty Breathing Lying Down, Palpitations, Rapid Heart Rate, Shortness of Breath and Swelling of Extremities. Gastrointestinal Present- Abdominal Pain, Gets full quickly at meals, Hemorrhoids, Nausea and Rectal Pain. Not Present- Bloating, Bloody Stool, Change in Bowel Habits, Chronic diarrhea, Constipation, Difficulty Swallowing, Excessive gas, Indigestion and Vomiting. Female Genitourinary Not Present- Frequency, Nocturia, Painful Urination, Pelvic Pain and Urgency. Musculoskeletal Present- Back Pain. Not Present- Joint Pain, Joint Stiffness, Muscle Pain, Muscle Weakness and Swelling of Extremities. Neurological Present- Headaches. Not Present- Decreased Memory, Fainting, Numbness, Seizures, Tingling, Tremor, Trouble walking and Weakness. Endocrine Present- Cold Intolerance. Not Present- Excessive Hunger, Hair Changes, Heat Intolerance, Hot flashes and New Diabetes. Hematology Not Present- Blood Thinners, Easy Bruising, Excessive bleeding, Gland problems, HIV and Persistent Infections.  Vitals Misty Stanley [place] RMA; 03/23/2020 8:35 AM) 03/23/2020 8:34 AM Weight: 278.25 lb Height: 70in Body Surface Area: 2.4 m Body Mass Index: 39.92 kg/m  Temp.: 98.23F  Pulse: 86 (Regular)  P.OX: 100% (Room air) BP: 122/76(Sitting, Left Arm, Standard)        Physical  Exam Connie Sportsman MD; 03/23/2020 9:12 AM)  General Mental Status-Alert. General Appearance-Not in acute distress, Not Sickly. Orientation-Oriented X3. Hydration-Well hydrated. Voice-Normal.  Integumentary Global Assessment Upon inspection and palpation of skin surfaces of the - Axillae: non-tender, no inflammation or ulceration, no drainage. and Distribution of scalp and body hair is normal. General Characteristics Temperature - normal warmth is noted.  Head and Neck Head-normocephalic, atraumatic with no lesions or palpable masses. Face Global Assessment - atraumatic, no absence of expression. Neck Global Assessment - no abnormal movements, no bruit auscultated on the right, no bruit auscultated on the left, no decreased range of motion, non-tender. Trachea-midline. Thyroid Gland Characteristics - non-tender.  Eye Eyeball - Left-Extraocular movements intact, No Nystagmus - Left. Eyeball - Right-Extraocular movements intact, No Nystagmus - Right. Cornea - Left-No Hazy - Left. Cornea - Right-No Hazy - Right. Sclera/Conjunctiva - Left-No scleral icterus, No Discharge - Left. Sclera/Conjunctiva - Right-No scleral icterus, No Discharge - Right. Pupil - Left-Direct reaction to light normal. Pupil - Right-Direct reaction to light normal.  ENMT Ears Pinna - Left - no drainage observed, no generalized tenderness observed. Pinna - Right - no drainage observed, no generalized tenderness observed. Nose and Sinuses External Inspection of the Nose - no destructive lesion observed. Inspection of the nares - Left - quiet respiration. Inspection of the nares - Right - quiet respiration. Mouth and Throat Lips - Upper Lip - no fissures observed, no pallor noted. Lower Lip - no fissures observed, no pallor noted. Nasopharynx - no discharge present. Oral Cavity/Oropharynx - Tongue - no dryness observed. Oral Mucosa - no cyanosis observed. Hypopharynx - no  evidence of airway distress observed.  Chest and Lung Exam Inspection Movements - Normal and Symmetrical. Accessory muscles - No use of accessory muscles in breathing. Palpation Palpation of the chest reveals - Non-tender. Auscultation Breath sounds - Normal and Clear.  Cardiovascular Auscultation Rhythm - Regular. Murmurs & Other Heart Sounds - Auscultation of the heart reveals - No Murmurs and No Systolic Clicks.  Abdomen Inspection Inspection of the abdomen reveals - No Visible peristalsis and No Abnormal pulsations. Umbilicus - No Bleeding, No Urine drainage. Palpation/Percussion Palpation and Percussion of the abdomen reveal - Soft, Non Tender, No  Rebound tenderness, No Rigidity (guarding) and No Cutaneous hyperesthesia. Note: Abdomen soft. Obese. Laparoscopic incisions healed. Mild diastases recti supraumbilically but now periumbilical hernia. Not severely distended. No umbilical or other anterior abdominal wall hernias. 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  Female Genitourinary Sexual Maturity Tanner 5 - Adult hair pattern. Note: No vaginal bleeding nor discharge  Rectal Note: Perianal skin clear. External hemorrhoids especially right posterior. Sensitive. Barely tolerates digital exam. I feel no fissure. No obvious abscess. Some fullness and thickening suspicious for at least grade 2 internal hemorrhoids. Nothing obviously prolapsed. No pilonidal disease. No pruritus. Held off on anoscopy given her sensitivity.  Peripheral Vascular Upper Extremity Inspection - Left - No Cyanotic nailbeds - Left, Not Ischemic. Inspection - Right - No Cyanotic nailbeds - Right, Not Ischemic.  Neurologic Neurologic evaluation reveals -normal attention span and ability to concentrate, able to name objects and repeat phrases. Appropriate fund of knowledge , normal sensation and normal coordination. Mental Status Affect - not  angry, not paranoid. Cranial Nerves-Normal Bilaterally. Gait-Normal.  Neuropsychiatric Mental status exam performed with findings of-able to articulate well with normal speech/language, rate, volume and coherence, thought content normal with ability to perform basic computations and apply abstract reasoning and no evidence of hallucinations, delusions, obsessions or homicidal/suicidal ideation.  Musculoskeletal Global Assessment Spine, Ribs and Pelvis - no instability, subluxation or laxity. Right Upper Extremity - no instability, subluxation or laxity.  Lymphatic Head & Neck  General Head & Neck Lymphatics: Bilateral - Description - No Localized lymphadenopathy. Axillary  General Axillary Region: Bilateral - Description - No Localized lymphadenopathy. Femoral & Inguinal  Generalized Femoral & Inguinal Lymphatics: Left - Description - No Localized lymphadenopathy. Right - Description - No Localized lymphadenopathy.    Assessment & Plan Connie Sportsman MD; 03/23/2020 9:22 AM)  EXTERNAL HEMORRHOIDS WITH COMPLICATION (K64.4) Impression: Struggling with hemorrhoidal pain and irritation despite good bowel regimen. I think she would benefit from outpatient surgery. Internal hemorrhoidal ligation of pexy with at least external possible internal/external hemorrhoidectomy depending on operative findings. Should be outpatient surgery.  Again recommended she use her MiraLAX on a daily basis to avoid the intermittent constipation that most likely triggers her flares.  I did caution that she will struggle pain and discomfort especially the first 2 weeks but should be tolerable. She is motivated to have a good bowel regimen to avoid future flares.  The anatomy & physiology of the anorectal region was discussed. The pathophysiology of hemorrhoids and differential diagnosis was discussed. Natural history progression was discussed. I stressed the importance of a bowel regimen to have daily  soft bowel movements to minimize progression of disease. Goal of one BM / day ideal. Use of wet wipes, warm baths, avoiding straining, etc were emphasized.  Educational handouts further explaining the pathology, treatment options, and bowel regimen were given as well. The patient expressed understanding.   ENCOUNTER FOR PREOPERATIVE EXAMINATION FOR GENERAL SURGICAL PROCEDURE (Z01.818)  Current Plans You are being scheduled for surgery- Our schedulers will call you.  You should hear from our office's scheduling department within 5 working days about the location, date, and time of surgery. We try to make accommodations for patient's preferences in scheduling surgery, but sometimes the OR schedule or the surgeon's schedule prevents [place] from making those accommodations.  If you have not heard from our office [phone]) in 5 working days, call the office and ask for your surgeon's nurse.  If you have other questions  about your diagnosis, plan, or surgery, call the office and ask for your surgeon's nurse.  Pt Education - [facility] Rectal Prep for Anorectal outpatient/office surgery: discussed with patient and provided information. Pt Education - [facility] Rectal Surgery HCI (Yuritza Paulhus): discussed with patient and provided information.  PROLAPSED INTERNAL HEMORRHOIDS, GRADE 2 (K64.1)  Current Plans Pt Education - [facility] Hemorrhoids (Bernisha Verma): discussed with patient and provided information. Pt Education - Pamphlet Given - The Hemorrhoid Book: discussed with patient and provided information. The anatomy & physiology of the anorectal region was discussed. The pathophysiology of hemorrhoids and differential diagnosis was discussed. Natural history risks without surgery was discussed. I stressed the importance of a bowel regimen to have daily soft bowel movements to minimize progression of disease. Interventions such as sclerotherapy & banding were discussed.  The patient's symptoms are not adequately  controlled by medicines and other non-operative treatments. I feel the risks & problems of no surgery outweigh the operative risks; therefore, I recommended surgery to treat the hemorrhoids by ligation, pexy, and possible resection.  Risks such as bleeding, infection, urinary difficulties, need for further treatment, heart attack, death, and other risks were discussed. I noted a good likelihood this will help address the problem. Goals of post-operative recovery were discussed as well. Possibility that this will not correct all symptoms was explained. Post-operative pain, bleeding, constipation, and other problems after surgery were discussed. We will work to minimize complications. Educational handouts further explaining the pathology, treatment options, and bowel regimen were given as well. Questions were answered. The patient expresses understanding & wishes to proceed with surgery.   TOBACCO ABUSE (Z72.0) Impression: STOP SMOKING!  We talked to the patient about the dangers of smoking. We stressed that tobacco use dramatically increases the risk of peri-operative complications such as infection, tissue necrosis leaving to problems with incision/wound and organ healing, hernia, chronic pain, heart attack, stroke, DVT, pulmonary embolism, and death. We noted there are programs in our community to help stop smoking. Information was available.  Current Plans Pt Education - [facility] STOP SMOKING!  HEARTBURN (R12) Impression: Sounds like pretty classic story of heartburn after pregnancy with obesity.  Avoid nonsteroidals. Avoid heavy caffeine intake. Consider trial of omeprazole twice a day for 3 weeks. Consider seeing gastroenterology if still struggling.  Connie Sportsman, MD, FACS, MASCRS Gastrointestinal and Minimally Invasive Surgery  [facility] [address] [place], [place] [address] [phone] Fax [phone] Main/Paging  CONTACT  INFORMATION: Weekday (9AM-5PM) concerns: Call [facility] main office at [phone] Weeknight (5PM-9AM) or Weekend/Holiday concerns: Check [email or web address] for General Surgery [facility] coverage (Please, do not use SecureChat as it is not reliable communication to operating surgeons for immediate patient care)

## 2020-03-30 DIAGNOSIS — Z419 Encounter for procedure for purposes other than remedying health state, unspecified: Secondary | ICD-10-CM | POA: Diagnosis not present

## 2020-04-29 DIAGNOSIS — Z419 Encounter for procedure for purposes other than remedying health state, unspecified: Secondary | ICD-10-CM | POA: Diagnosis not present

## 2020-05-30 DIAGNOSIS — Z419 Encounter for procedure for purposes other than remedying health state, unspecified: Secondary | ICD-10-CM | POA: Diagnosis not present

## 2020-06-08 ENCOUNTER — Other Ambulatory Visit (HOSPITAL_COMMUNITY)
Admission: RE | Admit: 2020-06-08 | Discharge: 2020-06-08 | Disposition: A | Discharge status: Home / Self Care | Payer: Medicaid Other | Source: Ambulatory Visit | Attending: Surgery | Admitting: Surgery

## 2020-06-08 DIAGNOSIS — Z01812 Encounter for preprocedural laboratory examination: Secondary | ICD-10-CM | POA: Diagnosis not present

## 2020-06-08 DIAGNOSIS — Z20822 Contact with and (suspected) exposure to covid-19: Secondary | ICD-10-CM | POA: Diagnosis not present

## 2020-06-08 LAB — SARS CORONAVIRUS 2 (TAT 6-24 HRS): SARS Coronavirus 2: NEGATIVE

## 2020-06-10 ENCOUNTER — Other Ambulatory Visit: Payer: Self-pay

## 2020-06-10 ENCOUNTER — Encounter (HOSPITAL_BASED_OUTPATIENT_CLINIC_OR_DEPARTMENT_OTHER): Payer: Self-pay | Admitting: Surgery

## 2020-06-10 NOTE — Progress Notes (Signed)
Spoke w/ via phone for pre-op interview---pt Lab needs dos----I stat ekg urine poct               Lab results------none COVID test ------06-08-2020 1000 am Arrive at -------830 am 06-11-2020 NPO after MN NO Solid Food.  Clear liquids from MN until---730 am Medications to take morning of surgery -----amlodipine  Diabetic medication -----n/a Patient Special Instructions -----patient to call dr gross office and make aware cannot take milk of magnesia and is uising  2 of 20 ounce bottles of gatorade with  6 caps of mirallax in each. Pre-Op special Istructions ----- Patient verbalized understanding of instructions that were given at this phone interview. Patient denies shortness of breath, chest pain, fever, cough at this phone interview.

## 2020-06-11 ENCOUNTER — Other Ambulatory Visit: Payer: Self-pay

## 2020-06-11 ENCOUNTER — Encounter (HOSPITAL_BASED_OUTPATIENT_CLINIC_OR_DEPARTMENT_OTHER)
Admission: RE | Disposition: A | Discharge status: Home / Self Care | Payer: Self-pay | Source: Home / Self Care | Attending: Surgery

## 2020-06-11 ENCOUNTER — Encounter (HOSPITAL_BASED_OUTPATIENT_CLINIC_OR_DEPARTMENT_OTHER): Payer: Self-pay | Admitting: Surgery

## 2020-06-11 ENCOUNTER — Ambulatory Visit (HOSPITAL_BASED_OUTPATIENT_CLINIC_OR_DEPARTMENT_OTHER)
Admission: RE | Admit: 2020-06-11 | Discharge: 2020-06-11 | Disposition: A | Discharge status: Home / Self Care | Payer: Medicaid Other | Attending: Surgery | Admitting: Surgery

## 2020-06-11 ENCOUNTER — Ambulatory Visit (HOSPITAL_BASED_OUTPATIENT_CLINIC_OR_DEPARTMENT_OTHER): Payer: Medicaid Other | Admitting: Anesthesiology

## 2020-06-11 DIAGNOSIS — K643 Fourth degree hemorrhoids: Secondary | ICD-10-CM | POA: Diagnosis not present

## 2020-06-11 DIAGNOSIS — I10 Essential (primary) hypertension: Secondary | ICD-10-CM | POA: Diagnosis not present

## 2020-06-11 DIAGNOSIS — N816 Rectocele: Secondary | ICD-10-CM | POA: Insufficient documentation

## 2020-06-11 DIAGNOSIS — K644 Residual hemorrhoidal skin tags: Secondary | ICD-10-CM | POA: Diagnosis not present

## 2020-06-11 DIAGNOSIS — K642 Third degree hemorrhoids: Secondary | ICD-10-CM | POA: Insufficient documentation

## 2020-06-11 DIAGNOSIS — F17210 Nicotine dependence, cigarettes, uncomplicated: Secondary | ICD-10-CM | POA: Insufficient documentation

## 2020-06-11 DIAGNOSIS — K648 Other hemorrhoids: Secondary | ICD-10-CM | POA: Diagnosis not present

## 2020-06-11 DIAGNOSIS — K649 Unspecified hemorrhoids: Secondary | ICD-10-CM | POA: Diagnosis not present

## 2020-06-11 DIAGNOSIS — J45909 Unspecified asthma, uncomplicated: Secondary | ICD-10-CM | POA: Diagnosis not present

## 2020-06-11 HISTORY — DX: Dermatitis, unspecified: L30.9

## 2020-06-11 HISTORY — DX: Unspecified osteoarthritis, unspecified site: M19.90

## 2020-06-11 HISTORY — PX: EVALUATION UNDER ANESTHESIA WITH HEMORRHOIDECTOMY: SHX5624

## 2020-06-11 HISTORY — DX: Presence of spectacles and contact lenses: Z97.3

## 2020-06-11 LAB — POCT I-STAT, CHEM 8
BUN: 14 mg/dL (ref 6–20)
Calcium, Ion: 1.06 mmol/L — ABNORMAL LOW (ref 1.15–1.40)
Chloride: 106 mmol/L (ref 98–111)
Creatinine, Ser: 0.6 mg/dL (ref 0.44–1.00)
Glucose, Bld: 100 mg/dL — ABNORMAL HIGH (ref 70–99)
HCT: 36 % (ref 36.0–46.0)
Hemoglobin: 12.2 g/dL (ref 12.0–15.0)
Potassium: 3.5 mmol/L (ref 3.5–5.1)
Sodium: 137 mmol/L (ref 135–145)
TCO2: 22 mmol/L (ref 22–32)

## 2020-06-11 LAB — POCT PREGNANCY, URINE: Preg Test, Ur: NEGATIVE

## 2020-06-11 SURGERY — EXAM UNDER ANESTHESIA WITH HEMORRHOIDECTOMY
Anesthesia: General

## 2020-06-11 MED ORDER — PHENYLEPHRINE 40 MCG/ML (10ML) SYRINGE FOR IV PUSH (FOR BLOOD PRESSURE SUPPORT)
PREFILLED_SYRINGE | INTRAVENOUS | Status: AC
  Filled 2020-06-11: qty 10

## 2020-06-11 MED ORDER — MIDAZOLAM HCL 2 MG/2ML IJ SOLN
INTRAMUSCULAR | Status: DC | PRN
  Administered 2020-06-11: 2 mg via INTRAVENOUS

## 2020-06-11 MED ORDER — CLINDAMYCIN PHOSPHATE 900 MG/50ML IV SOLN
INTRAVENOUS | Status: AC
  Filled 2020-06-11: qty 50

## 2020-06-11 MED ORDER — BUPIVACAINE LIPOSOME 1.3 % IJ SUSP: 20.0000 mL | Freq: Once | INTRAMUSCULAR | Status: DC

## 2020-06-11 MED ORDER — PHENYLEPHRINE 40 MCG/ML (10ML) SYRINGE FOR IV PUSH (FOR BLOOD PRESSURE SUPPORT)
PREFILLED_SYRINGE | INTRAVENOUS | Status: DC | PRN
  Administered 2020-06-11 (×5): 80 ug via INTRAVENOUS

## 2020-06-11 MED ORDER — LACTATED RINGERS IV SOLN: INTRAVENOUS | Status: DC

## 2020-06-11 MED ORDER — CELECOXIB 200 MG PO CAPS
ORAL_CAPSULE | ORAL | Status: AC
  Filled 2020-06-11: qty 1

## 2020-06-11 MED ORDER — LIDOCAINE HCL (PF) 2 % IJ SOLN
INTRAMUSCULAR | Status: AC
  Filled 2020-06-11: qty 5

## 2020-06-11 MED ORDER — DEXAMETHASONE SODIUM PHOSPHATE 10 MG/ML IJ SOLN
INTRAMUSCULAR | Status: AC
  Filled 2020-06-11: qty 1

## 2020-06-11 MED ORDER — SUCCINYLCHOLINE CHLORIDE 200 MG/10ML IV SOSY
PREFILLED_SYRINGE | INTRAVENOUS | Status: DC | PRN
  Administered 2020-06-11: 140 mg via INTRAVENOUS

## 2020-06-11 MED ORDER — MEPERIDINE HCL 25 MG/ML IJ SOLN: 6.2500 mg | INTRAMUSCULAR | Status: DC | PRN

## 2020-06-11 MED ORDER — MORPHINE SULFATE (PF) 4 MG/ML IV SOLN
1.0000 mg | INTRAVENOUS | Status: DC | PRN
  Administered 2020-06-11: 2 mg via INTRAVENOUS

## 2020-06-11 MED ORDER — KETOROLAC TROMETHAMINE 30 MG/ML IJ SOLN
30.0000 mg | Freq: Once | INTRAMUSCULAR | Status: AC | PRN
  Administered 2020-06-11: 30 mg via INTRAVENOUS

## 2020-06-11 MED ORDER — GABAPENTIN 300 MG PO CAPS
300.0000 mg | ORAL_CAPSULE | ORAL | Status: AC
  Administered 2020-06-11: 300 mg via ORAL

## 2020-06-11 MED ORDER — ROCURONIUM BROMIDE 10 MG/ML (PF) SYRINGE
PREFILLED_SYRINGE | INTRAVENOUS | Status: AC
  Filled 2020-06-11: qty 10

## 2020-06-11 MED ORDER — FENTANYL CITRATE (PF) 100 MCG/2ML IJ SOLN
INTRAMUSCULAR | Status: AC
  Filled 2020-06-11: qty 2

## 2020-06-11 MED ORDER — ENSURE PRE-SURGERY PO LIQD: 296.0000 mL | Freq: Once | ORAL | Status: DC

## 2020-06-11 MED ORDER — OXYCODONE HCL 5 MG PO TABS
5.0000 mg | ORAL_TABLET | Freq: Once | ORAL | Status: AC | PRN
  Administered 2020-06-11: 5 mg via ORAL

## 2020-06-11 MED ORDER — OXYCODONE HCL 5 MG/5ML PO SOLN: 5.0000 mg | Freq: Once | ORAL | Status: AC | PRN

## 2020-06-11 MED ORDER — DIBUCAINE (PERIANAL) 1 % EX OINT
TOPICAL_OINTMENT | CUTANEOUS | Status: DC | PRN
  Administered 2020-06-11: 1 via RECTAL

## 2020-06-11 MED ORDER — GENTAMICIN SULFATE 40 MG/ML IJ SOLN
5.0000 mg/kg | INTRAVENOUS | Status: AC
  Administered 2020-06-11: 460 mg via INTRAVENOUS
  Filled 2020-06-11: qty 11.5

## 2020-06-11 MED ORDER — KETOROLAC TROMETHAMINE 30 MG/ML IJ SOLN
INTRAMUSCULAR | Status: AC
  Filled 2020-06-11: qty 1

## 2020-06-11 MED ORDER — ACETAMINOPHEN 500 MG PO TABS
1000.0000 mg | ORAL_TABLET | Freq: Once | ORAL | Status: AC
  Administered 2020-06-11: 1000 mg via ORAL

## 2020-06-11 MED ORDER — PROPOFOL 10 MG/ML IV BOLUS
INTRAVENOUS | Status: DC | PRN
  Administered 2020-06-11: 200 mg via INTRAVENOUS

## 2020-06-11 MED ORDER — FENTANYL CITRATE (PF) 100 MCG/2ML IJ SOLN
INTRAMUSCULAR | Status: DC | PRN
  Administered 2020-06-11: 100 ug via INTRAVENOUS
  Administered 2020-06-11 (×2): 50 ug via INTRAVENOUS

## 2020-06-11 MED ORDER — LIDOCAINE 2% (20 MG/ML) 5 ML SYRINGE
INTRAMUSCULAR | Status: DC | PRN
  Administered 2020-06-11: 80 mg via INTRAVENOUS

## 2020-06-11 MED ORDER — EPHEDRINE SULFATE-NACL 50-0.9 MG/10ML-% IV SOSY
PREFILLED_SYRINGE | INTRAVENOUS | Status: DC | PRN
  Administered 2020-06-11: 10 mg via INTRAVENOUS

## 2020-06-11 MED ORDER — BUPIVACAINE LIPOSOME 1.3 % IJ SUSP
INTRAMUSCULAR | Status: DC | PRN
  Administered 2020-06-11: 20 mL

## 2020-06-11 MED ORDER — SUCCINYLCHOLINE CHLORIDE 200 MG/10ML IV SOSY
PREFILLED_SYRINGE | INTRAVENOUS | Status: AC
  Filled 2020-06-11: qty 10

## 2020-06-11 MED ORDER — CHLORHEXIDINE GLUCONATE CLOTH 2 % EX PADS: 6.0000 | MEDICATED_PAD | Freq: Once | CUTANEOUS | Status: DC

## 2020-06-11 MED ORDER — MIDAZOLAM HCL 2 MG/2ML IJ SOLN
INTRAMUSCULAR | Status: AC
  Filled 2020-06-11: qty 2

## 2020-06-11 MED ORDER — SCOPOLAMINE 1 MG/3DAYS TD PT72
1.0000 | MEDICATED_PATCH | TRANSDERMAL | Status: DC
  Administered 2020-06-11: 1.5 mg via TRANSDERMAL

## 2020-06-11 MED ORDER — DIAZEPAM 5 MG PO TABS: 5.0000 mg | ORAL_TABLET | Freq: Four times a day (QID) | ORAL | 2 refills | Status: AC | PRN

## 2020-06-11 MED ORDER — PROMETHAZINE HCL 25 MG/ML IJ SOLN: 6.2500 mg | INTRAMUSCULAR | Status: DC | PRN

## 2020-06-11 MED ORDER — OXYCODONE HCL 5 MG PO TABS
ORAL_TABLET | ORAL | Status: AC
  Filled 2020-06-11: qty 1

## 2020-06-11 MED ORDER — BUPIVACAINE-EPINEPHRINE 0.5% -1:200000 IJ SOLN
INTRAMUSCULAR | Status: DC | PRN
  Administered 2020-06-11: 20 mL

## 2020-06-11 MED ORDER — ONDANSETRON HCL 4 MG/2ML IJ SOLN
INTRAMUSCULAR | Status: DC | PRN
  Administered 2020-06-11: 4 mg via INTRAVENOUS

## 2020-06-11 MED ORDER — MORPHINE SULFATE (PF) 4 MG/ML IV SOLN
INTRAVENOUS | Status: AC
  Filled 2020-06-11: qty 1

## 2020-06-11 MED ORDER — FENTANYL CITRATE (PF) 100 MCG/2ML IJ SOLN
25.0000 ug | INTRAMUSCULAR | Status: DC | PRN
Start: 2020-06-11 — End: 2020-06-11
  Administered 2020-06-11: 25 ug via INTRAVENOUS
  Administered 2020-06-11: 50 ug via INTRAVENOUS
  Administered 2020-06-11: 25 ug via INTRAVENOUS

## 2020-06-11 MED ORDER — ACETAMINOPHEN 500 MG PO TABS: 1000.0000 mg | ORAL_TABLET | ORAL | Status: DC

## 2020-06-11 MED ORDER — ROCURONIUM BROMIDE 10 MG/ML (PF) SYRINGE
PREFILLED_SYRINGE | INTRAVENOUS | Status: DC | PRN
  Administered 2020-06-11: 40 mg via INTRAVENOUS

## 2020-06-11 MED ORDER — EPHEDRINE 5 MG/ML INJ
INTRAVENOUS | Status: AC
  Filled 2020-06-11: qty 10

## 2020-06-11 MED ORDER — CELECOXIB 200 MG PO CAPS
200.0000 mg | ORAL_CAPSULE | ORAL | Status: AC
  Administered 2020-06-11: 200 mg via ORAL

## 2020-06-11 MED ORDER — DEXAMETHASONE SODIUM PHOSPHATE 10 MG/ML IJ SOLN
INTRAMUSCULAR | Status: DC | PRN
  Administered 2020-06-11: 10 mg via INTRAVENOUS

## 2020-06-11 MED ORDER — ONDANSETRON HCL 4 MG/2ML IJ SOLN
INTRAMUSCULAR | Status: AC
  Filled 2020-06-11: qty 2

## 2020-06-11 MED ORDER — CLINDAMYCIN PHOSPHATE 900 MG/50ML IV SOLN
900.0000 mg | INTRAVENOUS | Status: AC
  Administered 2020-06-11: 900 mg via INTRAVENOUS

## 2020-06-11 MED ORDER — OXYCODONE HCL 5 MG PO TABS: 5.0000 mg | ORAL_TABLET | Freq: Four times a day (QID) | ORAL | 0 refills | Status: AC | PRN

## 2020-06-11 MED ORDER — ACETAMINOPHEN 500 MG PO TABS
ORAL_TABLET | ORAL | Status: AC
  Filled 2020-06-11: qty 2

## 2020-06-11 MED ORDER — SUGAMMADEX SODIUM 200 MG/2ML IV SOLN
INTRAVENOUS | Status: DC | PRN
  Administered 2020-06-11: 300 mg via INTRAVENOUS

## 2020-06-11 MED ORDER — SCOPOLAMINE 1 MG/3DAYS TD PT72
MEDICATED_PATCH | TRANSDERMAL | Status: AC
  Filled 2020-06-11: qty 1

## 2020-06-11 MED ORDER — NAPROXEN 500 MG PO TABS: 500.0000 mg | ORAL_TABLET | Freq: Two times a day (BID) | ORAL | 1 refills | Status: DC | PRN

## 2020-06-11 MED ORDER — GABAPENTIN 300 MG PO CAPS
ORAL_CAPSULE | ORAL | Status: AC
  Filled 2020-06-11: qty 1

## 2020-06-11 SURGICAL SUPPLY — 54 items
APL SKNCLS STERI-STRIP NONHPOA (GAUZE/BANDAGES/DRESSINGS) ×1
BENZOIN TINCTURE PRP APPL 2/3 (GAUZE/BANDAGES/DRESSINGS) ×2 IMPLANT
BLADE HEX COATED 2.75 (ELECTRODE) ×2 IMPLANT
BLADE SURG 10 STRL SS (BLADE) IMPLANT
BLADE SURG 15 STRL LF DISP TIS (BLADE) IMPLANT
BLADE SURG 15 STRL SS (BLADE)
BRIEF STRETCH FOR OB PAD LRG (UNDERPADS AND DIAPERS) IMPLANT
CANISTER SUCT 1200ML W/VALVE (MISCELLANEOUS) ×2 IMPLANT
COVER BACK TABLE 60X90IN (DRAPES) ×2 IMPLANT
COVER MAYO STAND STRL (DRAPES) ×2 IMPLANT
COVER WAND RF STERILE (DRAPES) ×2 IMPLANT
DECANTER SPIKE VIAL GLASS SM (MISCELLANEOUS) ×2 IMPLANT
DRAPE HYSTEROSCOPY (MISCELLANEOUS) IMPLANT
DRAPE LAPAROTOMY 100X72 PEDS (DRAPES) ×2 IMPLANT
DRAPE SHEET LG 3/4 BI-LAMINATE (DRAPES) IMPLANT
DRSG PAD ABDOMINAL 8X10 ST (GAUZE/BANDAGES/DRESSINGS) ×2 IMPLANT
ELECT NEEDLE TIP 2.8 STRL (NEEDLE) IMPLANT
ELECT REM PT RETURN 9FT ADLT (ELECTROSURGICAL) ×2
ELECTRODE REM PT RTRN 9FT ADLT (ELECTROSURGICAL) ×1 IMPLANT
FILTER STRAW (MISCELLANEOUS) IMPLANT
GAUZE SPONGE 4X4 12PLY STRL LF (GAUZE/BANDAGES/DRESSINGS) ×2 IMPLANT
GLOVE ECLIPSE 8.0 STRL XLNG CF (GLOVE) ×2 IMPLANT
GLOVE INDICATOR 8.0 STRL GRN (GLOVE) ×2 IMPLANT
GOWN STRL REUS W/TWL XL LVL3 (GOWN DISPOSABLE) ×2 IMPLANT
IV CATH PLACEMENT 20 GA (IV SOLUTION) ×2 IMPLANT
KIT SIGMOIDOSCOPE (SET/KITS/TRAYS/PACK) IMPLANT
KIT TURNOVER CYSTO (KITS) ×2 IMPLANT
LEGGING LITHOTOMY PAIR STRL (DRAPES) IMPLANT
NEEDLE HYPO 22GX1.5 SAFETY (NEEDLE) ×2 IMPLANT
NS IRRIG 500ML POUR BTL (IV SOLUTION) ×2 IMPLANT
PACK BASIN DAY SURGERY FS (CUSTOM PROCEDURE TRAY) ×2 IMPLANT
PAD PREP 24X48 CUFFED NSTRL (MISCELLANEOUS) ×2 IMPLANT
PENCIL SMOKE EVACUATOR (MISCELLANEOUS) ×2 IMPLANT
SCRUB TECHNI CARE 4 OZ NO DYE (MISCELLANEOUS) IMPLANT
SHEARS HARMONIC 9CM CVD (BLADE) IMPLANT
SURGILUBE 2OZ TUBE FLIPTOP (MISCELLANEOUS) ×2 IMPLANT
SUT CHROMIC 2 0 SH (SUTURE) ×2 IMPLANT
SUT CHROMIC 3 0 SH 27 (SUTURE) IMPLANT
SUT VIC AB 2-0 SH 27 (SUTURE)
SUT VIC AB 2-0 SH 27XBRD (SUTURE) IMPLANT
SUT VIC AB 2-0 UR6 27 (SUTURE) ×12 IMPLANT
SUT VICRYL 0 UR6 27IN ABS (SUTURE) IMPLANT
SUT VICRYL AB 2 0 TIE (SUTURE) IMPLANT
SUT VICRYL AB 2 0 TIES (SUTURE)
SYR 20ML LL LF (SYRINGE) ×2 IMPLANT
SYR 27GX1/2 1ML LL SAFETY (SYRINGE) ×2 IMPLANT
SYR BULB IRRIG 60ML STRL (SYRINGE) ×2 IMPLANT
SYR CONTROL 10ML LL (SYRINGE) IMPLANT
TAPE CLOTH 3X10 TAN LF (GAUZE/BANDAGES/DRESSINGS) ×2 IMPLANT
TOWEL OR 17X26 10 PK STRL BLUE (TOWEL DISPOSABLE) ×2 IMPLANT
TRAY DSU PREP LF (CUSTOM PROCEDURE TRAY) ×2 IMPLANT
TUBE CONNECTING 12X1/4 (SUCTIONS) ×2 IMPLANT
UNDERPAD 30X36 HEAVY ABSORB (UNDERPADS AND DIAPERS) ×2 IMPLANT
YANKAUER SUCT BULB TIP NO VENT (SUCTIONS) ×2 IMPLANT

## 2020-06-11 NOTE — Op Note (Signed)
06/11/2020  11:18 AM  PATIENT:  Connie [name]  40 y.o. female  Patient Care Team: Fleet Contras, MD as PCP - General (Internal Medicine) Karie Soda, MD as Consulting Physician (General Surgery)  PRE-OPERATIVE DIAGNOSIS:  HEMORRHOIDS EXTERNAL AND INTERNAL WITH BLEEDING  POST-OPERATIVE DIAGNOSIS:  HEMORRHOIDS EXTERNAL AND GRADE 3-4 INTERNAL WITH PROLAPSE & BLEEDING  PROCEDURE:   Internal and external hemorrhoidectomy x1  Internal hemorrhoidectomy x 2  External hemorrhoidectomy x 1 Internal hemorrhoidal ligation and pexy Anorectal examination under anesthesia  SURGEON:  Ardeth Sportsman, MD  ANESTHESIA:   General Anorectal & Local field block (0.25% bupivacaine with epinephrine mixed with Liposomal bupivacaine (Experel)   EBL:  Total I/O In: 1061.5 [I.V.:900; IV Piggyback:161.5] Out: - .  See operative record  Delay start of Pharmacological VTE agent (>24hrs) due to surgical blood loss or risk of bleeding:  NO  DRAINS: NONE  SPECIMEN:   Internal & external hemorrhoid x1 - left lateral Internal hemorrhoid x 2 - right anterior & left anterior  External hemorrhoid x 1 - right posterior tag  DISPOSITION OF SPECIMEN:  PATHOLOGY  COUNTS:  YES  PLAN OF CARE: Discharge home after PACU  PATIENT DISPOSITION:  PACU - hemodynamically stable.  INDICATION: Pleasant patient with struggles with hemorrhoids.  Not able to be managed in the office despite an improved bowel regimen.  I recommended examination under anesthesia and surgical treatment:  The anatomy & physiology of the anorectal region was discussed.  The pathophysiology of hemorrhoids and differential diagnosis was discussed.  Natural history risks without surgery was discussed.   I stressed the importance of a bowel regimen to have daily soft bowel movements to minimize progression of disease.  Interventions such as sclerotherapy & banding were discussed.  The patient's symptoms are not adequately controlled by  medicines and other non-operative treatments.  I feel the risks & problems of no surgery outweigh the operative risks; therefore, I recommended surgery to treat the hemorrhoids by ligation, pexy, and possible resection.  Risks such as bleeding, infection, need for further treatment, heart attack, death, and other risks were discussed.   I noted a good likelihood this will help address the problem.  Goals of post-operative recovery were discussed as well.  Possibility that this will not correct all symptoms was explained.  Post-operative pain, bleeding, constipation, urinary difficulties, and other problems after surgery were discussed.  We will work to minimize complications.   Educational handouts further explaining the pathology, treatment options, and bowel regimen were given as well.  Questions were answered.  The patient expresses understanding & wishes to proceed with surgery.  OR FINDINGS: Large internal and external hemorrhoids.  Prolapsing grade 3 right anterior and right posterior. Grade 2/3 left anterior  Left lateral grade 4 with external component.  Residual right posterior external hemorrhoidal tag.  No fissure or fistula. No stricture. Normal he is an intact rectum with mild rectocele.   DESCRIPTION:   Informed consent was confirmed. Patient underwent general anesthesia without difficulty. Patient was placed into prone positioning.  The perianal region was prepped and draped in sterile fashion. Surgical time-out confirmed our plan.  I did digital rectal examination and then transitioned over to anoscopy to get a sense of the anatomy.  Findings noted above.   I proceeded to do hemorrhoidal ligation and pexy.  I used a 2-0 Vicryl suture on a UR-6 needle in a figure-of-eight fashion 6 cm proximal to the anal verge.  I started at the largest hemorrhoid pile.  Because of redundant hemorrhoidal tissue too bulky to merely ligate or pexy, I excised the excess internal hemorrhoid piles  longitudinally in a fusiform biconcave fashion, sparing the anal canal to avoid narrowing.  I then ran that stitch longitudinally more distally to close the hemorrhoidectomy wound to the anal verge over a large Parks self retaining retractor to avoid narrowing of the anal canal.  I then tied that stitch down to cause a hemorrhoidopexy.   I also had to do an excision at the  right anterior, right anterior and left lateral pile locations. Left lateral had internal & external components I then did hemorrhoidal ligation and pexy at the other 3 hemorrhoidal columns.  She had persistent right posterior external hemorrhoid tag that I excised.  At the completion of this, all 6 anorectal columns were ligated and pexied in the classic hexagonal fashion (right anterior/lateral/posterior, left anterior/lateral/posterior).  I closed the external part of the hemorrhoidectomy wounds with radial interrupted horizontal mattress 2-0 chromic suture over a large Sawyer retractor, leaving the last 5 mm open to allow natural drainage.    I redid anoscopy & examination.   At completion of this, all hemorrhoids had been removed or reduced into the rectum.  There is no more prolapse.  Internal & external anatomy was more more normal.  Hemostasis was good.  Fluffed gauze was on-laid over the perianal region.  No packing done.  Patient is being extubated go to go to the recovery room.  I had discussed postop care in detail with the patient in the preop holding area.  Instructions for post-operative recovery and prescriptions are written. I discussed operative findings, updated the patient's status, discussed probable steps to recovery, and gave postoperative recommendations to the patient's friend, Connie [name].  Recommendations were made.  Questions were answered.  She expressed understanding & appreciation.  Ardeth Sportsman, M.D., F.A.C.S. Gastrointestinal and Minimally Invasive Surgery [facility] [address] [place], [place] [address] [phone] Main / Paging

## 2020-06-11 NOTE — Anesthesia Postprocedure Evaluation (Signed)
Anesthesia Post Note  Patient: Connie [name]  Procedure(s) Performed: EXAM UNDER ANESTHESIA WITH HEMORRHOIDECTOMY WITH LIGATION (N/A )     Patient location during evaluation: PACU Anesthesia Type: General Level of consciousness: awake and patient remains intubated per anesthesia plan Pain management: pain level controlled Vital Signs Assessment: post-procedure vital signs reviewed and stable Respiratory status: spontaneous breathing Cardiovascular status: stable Postop Assessment: no apparent nausea or vomiting Anesthetic complications: no   No complications documented.  Last Vitals:  Vitals:   06/11/20 1230 06/11/20 1301  BP: (!) 164/95 (!) 149/98  Pulse: 77 76  Resp: 18 20  Temp: 36.8 C 36.8 C  SpO2: 96% 100%    Last Pain:  Vitals:   06/11/20 1301  TempSrc:   PainSc: 9                  Sicilia Killough

## 2020-06-11 NOTE — H&P (Signed)
Connie [name]  DOB: Nov 14, 1980 Single / Language: [place] / Race: Black or African American Female   Patient Care Team: Fleet Contras, MD as PCP - General (Internal Medicine) Karie Soda, MD as Consulting Physician (General Surgery)  ` Patient sent for surgical consultation at the request of Dr Concepcion Elk  Chief Complaint: Hemorrhoids ` ` The patient is a pleasant woman that struggle with hemorrhoids since her last pregnancy. Feel something on the outside. Some irritation discomfort. Occasional blood. He moves her bowels once a day but occasionally could be constipated up to 3 days. Uses MiraLAX intermittently. She has not required any incision and drainage or anorectal surgeries or bandings. She's tried some over-the-counter creams with only mild relief. Given prescription suppositories without much help. Patient's concerns surgical consultation offered. Has history of heartburn and reflux with some intermittent epigastric discomfort. Usually over-the-counter baking soda works better than Tums. Does not have a gastroenterologist. Has never had prior endoscopy. She does smoke but is trying to quit. No diabetes. No cardiac or pulmonary issues. Can walk half hour without difficulty. She had laparoscopic cholecystectomy 2006 by Dr Ezzard Standing with our group as well as tubal ligation. No other abdominal surgeries. She did require incision and drainage of breast abscess in 2008 by Dr. Johna Sheriff with our group. His had Bartholin's cyst abscesses and other skin abscesses drained. No chronic hidradenitis.  No personal nor family history of GI/colon cancer, inflammatory bowel disease, irritable bowel syndrome, allergy such as Celiac Sprue, dietary/dairy problems, colitis, ulcers nor gastritis. No recent sick contacts/gastroenteritis. No travel outside the country. No changes in diet. No dysphagia to solids or liquids. No melena, hematemesis, coffee ground emesis. No evidence of  prior gastric/peptic ulceration.  (Review of systems as stated in this history (HPI) or in the review of systems. Otherwise all other 12 point ROS are negative) ` ` ###########################################`  This patient encounter took 30 minutes today to perform the following: obtain history, perform exam, review outside records, interpret tests & imaging, counsel the patient on their diagnosis; and, document this encounter, including findings & plan in the electronic health record (EHR).   Past Surgical History Laurette Schimke, [place]; 03/23/2020 8:32 AM) Breast Biopsy Left. Gallbladder Surgery - Laparoscopic Tonsillectomy  Diagnostic Studies History Laurette Schimke, [place]; 03/23/2020 8:32 AM) Colonoscopy never Mammogram >3 years ago Pap Smear >5 years ago  Allergies Laurette Schimke, RMA; 03/23/2020 8:34 AM) Penicillin G Pot in Dextrose *PENICILLINS* Amoxicillin *PENICILLINS* Dilaudid *ANALGESICS - OPIOID* Allergies Reconciled  Medication History Laurette Schimke, RMA; 03/23/2020 8:34 AM) Albuterol Sulfate ((2.5 MG/3ML)0.083% Nebulized Soln, Inhalation) Active. hydroCHLOROthiazide (25MG  Tablet, Oral) Active. Ibuprofen (Oral) Specific strength unknown - Active. Medications Reconciled  Social History , Laurette Schimke; 03/23/2020 8:32 AM) Caffeine use Carbonated beverages. No drug use  Family History 03/25/2020, Laurette Schimke; 03/23/2020 8:32 AM) Arthritis Mother. Hypertension Father. Respiratory Condition Father.  Pregnancy / Birth History 03/25/2020, Laurette Schimke; 03/23/2020 8:32 AM) Age at menarche 12 years. Gravida 4 Length (months) of breastfeeding 3-6 Maternal age 51-20 Para 3 Regular periods  Other Problems 19-38, Laurette Schimke; 03/23/2020 8:32 AM) Arthritis Asthma Back Pain Cholelithiasis Gastroesophageal Reflux Disease Hemorrhoids High blood pressure Transfusion history     Review of Systems 03/25/2020 RMA;  03/23/2020 8:32 AM) General Not Present- Appetite Loss, Chills, Fatigue, Fever, Night Sweats, Weight Gain and Weight Loss. Skin Not Present- Change in Wart/Mole, Dryness, Hives, Jaundice, New Lesions, Non-Healing Wounds, Rash and Ulcer. HEENT Present- Seasonal Allergies and Wears glasses/contact lenses. Not Present- Earache, Hearing Loss, Hoarseness, Nose  Bleed, Oral Ulcers, Ringing in the Ears, Sinus Pain, Sore Throat, Visual Disturbances and Yellow Eyes. Cardiovascular Present- Leg Cramps. Not Present- Chest Pain, Difficulty Breathing Lying Down, Palpitations, Rapid Heart Rate, Shortness of Breath and Swelling of Extremities. Gastrointestinal Present- Abdominal Pain, Gets full quickly at meals, Hemorrhoids, Nausea and Rectal Pain. Not Present- Bloating, Bloody Stool, Change in Bowel Habits, Chronic diarrhea, Constipation, Difficulty Swallowing, Excessive gas, Indigestion and Vomiting. Female Genitourinary Not Present- Frequency, Nocturia, Painful Urination, Pelvic Pain and Urgency. Musculoskeletal Present- Back Pain. Not Present- Joint Pain, Joint Stiffness, Muscle Pain, Muscle Weakness and Swelling of Extremities. Neurological Present- Headaches. Not Present- Decreased Memory, Fainting, Numbness, Seizures, Tingling, Tremor, Trouble walking and Weakness. Endocrine Present- Cold Intolerance. Not Present- Excessive Hunger, Hair Changes, Heat Intolerance, Hot flashes and New Diabetes. Hematology Not Present- Blood Thinners, Easy Bruising, Excessive bleeding, Gland problems, HIV and Persistent Infections.  Vitals Misty Stanley(Lisa [place] RMA; 03/23/2020 8:35 AM) 03/23/2020 8:34 AM Weight: 278.25 lb Height: 70in Body Surface Area: 2.4 m Body Mass Index: 39.92 kg/m  Temp.: 98.5F  Pulse: 86 (Regular)  P.OX: 100% (Room air) BP: 122/76(Sitting, Left Arm, Standard)        Physical Exam Ardeth Sportsman(Janmichael Giraud C. Randolf Sansoucie MD; 03/23/2020 9:12 AM)  General Mental Status-Alert. General Appearance-Not  in acute distress, Not Sickly. Orientation-Oriented X3. Hydration-Well hydrated. Voice-Normal.  Integumentary Global Assessment Upon inspection and palpation of skin surfaces of the - Axillae: non-tender, no inflammation or ulceration, no drainage. and Distribution of scalp and body hair is normal. General Characteristics Temperature - normal warmth is noted.  Head and Neck Head-normocephalic, atraumatic with no lesions or palpable masses. Face Global Assessment - atraumatic, no absence of expression. Neck Global Assessment - no abnormal movements, no bruit auscultated on the right, no bruit auscultated on the left, no decreased range of motion, non-tender. Trachea-midline. Thyroid Gland Characteristics - non-tender.  Eye Eyeball - Left-Extraocular movements intact, No Nystagmus - Left. Eyeball - Right-Extraocular movements intact, No Nystagmus - Right. Cornea - Left-No Hazy - Left. Cornea - Right-No Hazy - Right. Sclera/Conjunctiva - Left-No scleral icterus, No Discharge - Left. Sclera/Conjunctiva - Right-No scleral icterus, No Discharge - Right. Pupil - Left-Direct reaction to light normal. Pupil - Right-Direct reaction to light normal.  ENMT Ears Pinna - Left - no drainage observed, no generalized tenderness observed. Pinna - Right - no drainage observed, no generalized tenderness observed. Nose and Sinuses External Inspection of the Nose - no destructive lesion observed. Inspection of the nares - Left - quiet respiration. Inspection of the nares - Right - quiet respiration. Mouth and Throat Lips - Upper Lip - no fissures observed, no pallor noted. Lower Lip - no fissures observed, no pallor noted. Nasopharynx - no discharge present. Oral Cavity/Oropharynx - Tongue - no dryness observed. Oral Mucosa - no cyanosis observed. Hypopharynx - no evidence of airway distress observed.  Chest and Lung Exam Inspection Movements - Normal and  Symmetrical. Accessory muscles - No use of accessory muscles in breathing. Palpation Palpation of the chest reveals - Non-tender. Auscultation Breath sounds - Normal and Clear.  Cardiovascular Auscultation Rhythm - Regular. Murmurs & Other Heart Sounds - Auscultation of the heart reveals - No Murmurs and No Systolic Clicks.  Abdomen Inspection Inspection of the abdomen reveals - No Visible peristalsis and No Abnormal pulsations. Umbilicus - No Bleeding, No Urine drainage. Palpation/Percussion Palpation and Percussion of the abdomen reveal - Soft, Non Tender, No Rebound tenderness, No Rigidity (guarding) and No Cutaneous hyperesthesia. Note: Abdomen soft. Obese. Laparoscopic incisions healed.  Mild diastases recti supraumbilically but now periumbilical hernia. Not severely distended. No umbilical or other anterior abdominal wall hernias. 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  Female Genitourinary Sexual Maturity Tanner 5 - Adult hair pattern. Note: No vaginal bleeding nor discharge  Rectal Note: Perianal skin clear. External hemorrhoids especially right posterior. Sensitive. Barely tolerates digital exam. I feel no fissure. No obvious abscess. Some fullness and thickening suspicious for at least grade 2 internal hemorrhoids. Nothing obviously prolapsed. No pilonidal disease. No pruritus. Held off on anoscopy given her sensitivity.  Peripheral Vascular Upper Extremity Inspection - Left - No Cyanotic nailbeds - Left, Not Ischemic. Inspection - Right - No Cyanotic nailbeds - Right, Not Ischemic.  Neurologic Neurologic evaluation reveals -normal attention span and ability to concentrate, able to name objects and repeat phrases. Appropriate fund of knowledge , normal sensation and normal coordination. Mental Status Affect - not angry, not paranoid. Cranial Nerves-Normal  Bilaterally. Gait-Normal.  Neuropsychiatric Mental status exam performed with findings of-able to articulate well with normal speech/language, rate, volume and coherence, thought content normal with ability to perform basic computations and apply abstract reasoning and no evidence of hallucinations, delusions, obsessions or homicidal/suicidal ideation.  Musculoskeletal Global Assessment Spine, Ribs and Pelvis - no instability, subluxation or laxity. Right Upper Extremity - no instability, subluxation or laxity.  Lymphatic Head & Neck  General Head & Neck Lymphatics: Bilateral - Description - No Localized lymphadenopathy. Axillary  General Axillary Region: Bilateral - Description - No Localized lymphadenopathy. Femoral & Inguinal  Generalized Femoral & Inguinal Lymphatics: Left - Description - No Localized lymphadenopathy. Right - Description - No Localized lymphadenopathy.    Assessment & Plan  EXTERNAL HEMORRHOIDS WITH COMPLICATION (K64.4) Impression: Struggling with hemorrhoidal pain and irritation despite good bowel regimen. I think she would benefit from outpatient surgery. Internal hemorrhoidal ligation of pexy with at least external possible internal/external hemorrhoidectomy depending on operative findings. Should be outpatient surgery.  Again recommended she use her MiraLAX on a daily basis to avoid the intermittent constipation that most likely triggers her flares.  I did caution that she will struggle pain and discomfort especially the first 2 weeks but should be tolerable. She is motivated to have a good bowel regimen to avoid future flares.  The anatomy & physiology of the anorectal region was discussed. The pathophysiology of hemorrhoids and differential diagnosis was discussed. Natural history progression was discussed. I stressed the importance of a bowel regimen to have daily soft bowel movements to minimize progression of disease. Goal of one BM / day  ideal. Use of wet wipes, warm baths, avoiding straining, etc were emphasized.  Educational handouts further explaining the pathology, treatment options, and bowel regimen were given as well. The patient expressed understanding.  Current Plans Pt Education - [facility] Hemorrhoids (Zach Tietje): discussed with patient and provided information. Pt Education - Pamphlet Given - The Hemorrhoid Book: discussed with patient and provided information. The anatomy & physiology of the anorectal region was discussed. The pathophysiology of hemorrhoids and differential diagnosis was discussed. Natural history risks without surgery was discussed. I stressed the importance of a bowel regimen to have daily soft bowel movements to minimize progression of disease. Interventions such as sclerotherapy & banding were discussed.  The patient's symptoms are not adequately controlled by medicines and other non-operative treatments. I feel the risks & problems of no surgery outweigh the operative risks; therefore, I recommended surgery to treat the hemorrhoids by ligation, pexy, and possible resection.  Risks such as bleeding, infection, urinary difficulties, need for further treatment, heart attack, death, and other risks were discussed. I noted a good likelihood this will help address the problem. Goals of post-operative recovery were discussed as well. Possibility that this will not correct all symptoms was explained. Post-operative pain, bleeding, constipation, and other problems after surgery were discussed. We will work to minimize complications. Educational handouts further explaining the pathology, treatment options, and bowel regimen were given as well. Questions were answered. The patient expresses understanding & wishes to proceed with surgery.   TOBACCO ABUSE (Z72.0) Impression: STOP SMOKING!  We talked to the patient about the dangers of smoking. We stressed that tobacco use dramatically increases  the risk of peri-operative complications such as infection, tissue necrosis leaving to problems with incision/wound and organ healing, hernia, chronic pain, heart attack, stroke, DVT, pulmonary embolism, and death. We noted there are programs in our community to help stop smoking. Information was available.  Current Plans Pt Education - [facility] STOP SMOKING!  HEARTBURN (R12) Impression: Sounds like pretty classic story of heartburn after pregnancy with obesity.  Avoid nonsteroidals. Avoid heavy caffeine intake. Consider trial of omeprazole twice a day for 3 weeks. Consider seeing gastroenterology if still struggling.  Ardeth Sportsman, MD, FACS, MASCRS Gastrointestinal and Minimally Invasive Surgery  [facility] [address] [place], [place] [address] [phone] Fax [phone] Main/Paging  CONTACT INFORMATION: Weekday (9AM-5PM) concerns: Call [facility] main office at [phone] Weeknight (5PM-9AM) or Weekend/Holiday concerns: Check [email or web address] for General Surgery [facility] coverage (Please, do not use SecureChat as it is not reliable communication to operating surgeons for immediate patient care)

## 2020-06-11 NOTE — Anesthesia Procedure Notes (Signed)
Procedure Name: Intubation Date/Time: 06/11/2020 10:02 AM Performed by: Pearson Grippe, CRNA Pre-anesthesia Checklist: Patient identified, Emergency Drugs available, Suction available and Patient being monitored Patient Re-evaluated:Patient Re-evaluated prior to induction Oxygen Delivery Method: Circle system utilized Preoxygenation: Pre-oxygenation with 100% oxygen Induction Type: IV induction and Rapid sequence Ventilation: Mask ventilation without difficulty Laryngoscope Size: Miller and 2 Grade View: Grade II Tube type: Oral Tube size: 7.0 mm Number of attempts: 1 Airway Equipment and Method: Stylet and Oral airway Placement Confirmation: ETT inserted through vocal cords under direct vision,  positive ETCO2 and breath sounds checked- equal and bilateral Secured at: 22 cm Tube secured with: Tape Dental Injury: Teeth and Oropharynx as per pre-operative assessment

## 2020-06-11 NOTE — Transfer of Care (Signed)
Immediate Anesthesia Transfer of Care Note  Patient: Ulyses Southward  Procedure(s) Performed: EXAM UNDER ANESTHESIA WITH HEMORRHOIDECTOMY WITH LIGATION (N/A )  Patient Location: PACU  Anesthesia Type:General  Level of Consciousness: awake, alert  and oriented  Airway & Oxygen Therapy: Patient Spontanous Breathing and Patient connected to face mask oxygen  Post-op Assessment: Report given to RN and Post -op Vital signs reviewed and stable  Post vital signs: Reviewed and stable  Last Vitals:  Vitals Value Taken Time  BP 172/104   Temp    Pulse 78   Resp 14 06/11/20 1127  SpO2 100%   Vitals shown include unvalidated device data.  Last Pain:  Vitals:   06/11/20 0901  TempSrc: Oral  PainSc: 4       Patients Stated Pain Goal: 6 (06/11/20 0901)  Complications: No complications documented.

## 2020-06-11 NOTE — Discharge Instructions (Signed)
ANORECTAL SURGERY:  POST OPERATIVE INSTRUCTIONS  ######################################################################  EAT Start with a pureed / full liquid diet After 24 hours, gradually transition to a high fiber diet.    CONTROL PAIN Control pain so you can tolerate bowel movements,  walk, sleep, tolerate sneezing/coughing, and go up/down stairs.   HAVE A BOWEL MOVEMENT DAILY Keep your bowels regular to avoid problems.   Taking a fiber supplement every day to keep bowels soft.   Try a laxative to override constipation. Use an antidairrheal to slow down diarrhea.   Call if not better after 2 tries  WALK Walk an hour a day.  Control your pain to do that.   CALL IF YOU HAVE PROBLEMS/CONCERNS Call if you are still struggling despite following these instructions. Call if you have concerns not answered by these instructions  ######################################################################    1. Take your usually prescribed home medications unless otherwise directed.  2. DIET: Follow a light bland diet & liquids the first 24 hours after arrival home, such as soup, liquids, starches, etc.  Be sure to drink plenty of fluids.  Quickly advance to a usual solid diet within a few days.  Avoid fast food or heavy meals as your are more likely to get nauseated or have irregular bowels.  A low-fat, high-fiber diet for the rest of your life is ideal.  3. PAIN CONTROL: a. Pain is best controlled by a usual combination of three different methods TOGETHER: i. Ice/Heat ii. Over the counter pain medication iii. Prescription pain medication b. Expect swelling and discomfort in the anus/rectal area.  Warm water baths (30-60 minutes up to 6 times a day, especially after bowel meovements) will help. Use ice for the first few days to help decrease swelling and bruising, then switch to heat such as warm towels, sitz baths, warm baths, etc to help relax tight/sore spots and speed recovery.   Some people prefer to use ice alone, heat alone, alternating between ice & heat.  Experiment to what works for you.   c. It is helpful to take an over-the-counter pain medication continuously for the first few weeks.  Choose one of the following that works best for you: i. Naproxen (Aleve, etc)  Two 250m tabs twice a day ii. Ibuprofen (Advil, etc) Three 2072mtabs four times a day (every meal & bedtime) iii. Acetaminophen (Tylenol, etc) 500-65044mour times a day (every meal & bedtime) d. A  prescription for pain medication (such as oxycodone, hydrocodone, etc) should be given to you upon discharge.  Take your pain medication as prescribed.  i. If you are having problems/concerns with the prescription medicine (does not control pain, nausea, vomiting, rash, itching, etc), please call us [phone] see if we need to switch you to a different pain medicine that will work better for you and/or control your side effect better. ii. If you need a refill on your pain medication, please contact your pharmacy.  They will contact our office to request authorization. Prescriptions will not be filled after 5 pm or on week-ends.  If can take up to 48 hours for it to be filled & ready so avoid waiting until you are down to thel ast pill. e. A topical cream (Dibucaine) or a prescription for a cream (such as diltiazem 2% gel) may be given to you.  Many people find relief with topical creams.  Some people find it burns too much.  Experiment.  If it helps, use it.  If it burns, don't using  it.  Use a Sitz Bath 4-8 times a day for relief   [employer] A sitz bath is a warm water bath taken in the sitting position that covers only the hips and buttocks. It may be used for either healing or hygiene purposes. Sitz baths are also used to relieve pain, itching, or muscle spasms. The water may contain medicine. Moist heat will help you heal and relax.  HOME CARE INSTRUCTIONS  Take 3 to 4 sitz baths a day. 1. Fill the  bathtub half full with warm water. 2. Sit in the water and open the drain a little. 3. Turn on the warm water to keep the tub half full. Keep the water running constantly. 4. Soak in the water for 15 to 20 minutes. 5. After the sitz bath, pat the affected area dry first.   4. KEEP YOUR BOWELS REGULAR a. The goal is one soft bowel movement a day b. Avoid getting constipated.  Between the surgery and the pain medications, it is common to experience some constipation.  Increasing fluid intake and taking a fiber supplement (such as Metamucil, Citrucel, FiberCon, MiraLax, etc) 2-3 times a day regularly will usually help prevent this problem from occurring.  A mild laxative (prune juice, Milk of Magnesia, MiraLax, etc) should be taken according to package directions if there are no bowel movements after 48 hours. c. Watch out for diarrhea.  If you have many loose bowel movements, simplify your diet to bland foods & liquids for a few days.  Stop any stool softeners and decrease your fiber supplement.  Switching to mild anti-diarrheal medications (Kayopectate, Pepto Bismol) can help.  Can try an imodium/loperamide dose.  If this worsens or does not improve, please call us.  5. Wound Care  a. Remove your bandages with your first bowel movement, usually the day after surgery.  Let the gauze fall off with the first bowel movement or shower.   b. Wear an absorbent pad or soft cotton balls in your underwear as needed to catch any drainage and help keep the area  c. Keep the area clean and dry.  Bathe / shower every day.  Keep the area clean by showering / bathing over the incision / wound.   It is okay to soak an open wound to help wash it.  Consider using a squeeze bottle filled with warm water to gently wash the anal area.  Wet wipes or showers / gentle washing after bowel movements is often less traumatic than regular toilet paper. d. Dennis Bast will often notice bleeding with bowel movements.  This should slow down  by the end of the first week of surgery.  Sitting on an ice pack can help. e. Expect some drainage.  This should slow down by the end of the first week of surgery, but you will have occasional bleeding or drainage up to a few months after surgery.  Wear an absorbent pad or soft cotton gauze in your underwear until the drainage stops.  6. ACTIVITIES as tolerated:   a. You may resume regular (light) daily activities beginning the next day--such as daily self-care, walking, climbing stairs--gradually increasing activities as tolerated.  If you can walk 30 minutes without difficulty, it is safe to try more intense activity such as jogging, treadmill, bicycling, low-impact aerobics, swimming, etc. b. Save the most intensive and strenuous activity for last such as sit-ups, heavy lifting, contact sports, etc  Refrain from any heavy lifting or straining until you are off narcotics for  pain control.   c. DO NOT PUSH THROUGH PAIN.  Let pain be your guide: If it hurts to do something, don't do it.  Pain is your body warning you to avoid that activity for another week until the pain goes down. d. You may drive when you are no longer taking prescription pain medication, you can comfortably sit for long periods of time, and you can safely maneuver your car and apply brakes. e. You may have sexual intercourse when it is comfortable.  7. FOLLOW UP in our office a. Please call [facility] at [phone] to set up an appointment to see your surgeon in the office for a follow-up appointment approximately 2-3 weeks after your surgery. b. Make sure that you call for this appointment the day you arrive home to ensure a convenient appointment time.  8. IF YOU HAVE DISABILITY OR FAMILY LEAVE FORMS, BRING THEM TO THE OFFICE FOR PROCESSING.  DO NOT GIVE THEM TO YOUR DOCTOR.        WHEN TO CALL US [phone]: 1. Poor pain control 2. Reactions / problems with new medications (rash/itching, nausea, etc)  3. Fever over  101.5 F (38.5 C) 4. Inability to urinate 5. Nausea and/or vomiting 6. Worsening swelling or bruising 7. Continued bleeding from incision. 8. Increased pain, redness, or drainage from the incision  The clinic staff is available to answer your questions during regular business hours (8:30am-5pm).  Please don't hesitate to call and ask to speak to one of our nurses for clinical concerns.   A surgeon from [facility] is always on call at the hospitals   If you have a medical emergency, go to the nearest emergency room or call 911.    [facility] [address], [address], [place],   [address] ? MAIN: [phone] ? TOLL FREE: [phone] ? FAX [phone] [email or web address]     HEMORRHOIDS   Hemorrhoidal piles are natural clusters of blood vessels that help the rectum and anal canal stretch to hold stool and allow bowel movements.  Most people will develop a flare of hemorrhoids in their lifetime.  When hemorrhoidals are irritated, they can swell, burn, itch, cause pain, and bleed.  Most flares will calm down gradually within a few weeks.  However, once hemorrhoids are created, they tend to flare more easily.  Fortunately, good habits and simple medical treatment usually control hemorrhoids well, and surgery is needed only in severe cases.  TREATMENT OF HEMORRHOID FLARE Warm soaks. 4-8 times a day This helps more than any topical medication.   1. A sitz bath is a warm water bath taken in the sitting position that covers only the hips and buttocks.Fill the bathtub half full with warm water. 2. Soak in the water for 15 to 30 minutes. 3. After the sitz bath, pat the affected area dry first.  Normalize your bowels.  Extremes of diarrhea or constipation will make hemorrhoids worse.  One soft bowel movement a day is the goal.   Wet wipes instead of toilet paper Pain control with a NSAID such as ibuprofen (Advil) or naproxen (Aleve) or  acetaminophen (Tylenol) around the clock.  Narcotics are constipating and should be minimized if possible Topical creams contain steroids (bydrocortisone) or local anesthetic (xylocaine) can help make pain and itching more tolerable.    TROUBLESHOOTING IRREGULAR BOWELS 1) Avoid extremes of bowel movements (no bad constipation/diarrhea) 2) Miralax 17gm in 8oz. water or juice every day. May use twice a   3) Gas-x or Phazyme as needed for gas & bloating.  4) Soft & bland diet. No spicy, greasy, or fried foods.  5) Omeprazole over-the-counter as needed  6) May hold gluten/wheat products from diet to see if symptoms improve.  7)  May try probiotics (Align, Activa, etc) to help calm the bowels down 7) If symptoms become worse: Call back immediately.   Information for Discharge Teaching: EXPAREL (bupivacaine liposome injectable suspension)   Your surgeon or anesthesiologist gave you EXPAREL(bupivacaine) to help control your pain after surgery.   EXPAREL is a local anesthetic that provides pain relief by numbing the tissue around the surgical site.  EXPAREL is designed to release pain medication over time and can control pain for up to 72 hours.  Depending on how you respond to EXPAREL, you may require less pain medication during your recovery.  Possible side effects:  Temporary loss of sensation or ability to move in the area where bupivacaine was injected.  Nausea, vomiting, constipation  Rarely, numbness and tingling in your mouth or lips, lightheadedness, or anxiety may occur.  Call your doctor right away if you think you may be experiencing any of these sensations, or if you have other questions regarding possible side effects.  Follow all other discharge instructions given to you by your surgeon or nurse. Eat a healthy diet and drink plenty of water or other fluids.  If you return to the hospital for any reason within 96 hours following the administration of EXPAREL, it is  important for health care providers to know that you have received this anesthetic. A teal colored band has been placed on your arm with the date, time and amount of EXPAREL you have received in order to alert and inform your health care providers. Please leave this armband in place for the full 96 hours following administration, and then you may remove the band. Post Anesthesia Home Care Instructions  Activity: Get plenty of rest for the remainder of the day. A responsible individual must stay with you for 24 hours following the procedure.  For the next 24 hours, DO NOT: -Drive a car -Advertising copywriter -Drink alcoholic beverages -Take any medication unless instructed by your physician -Make any legal decisions or sign important papers.  Meals: Start with liquid foods such as gelatin or soup. Progress to regular foods as tolerated. Avoid greasy, spicy, heavy foods. If nausea and/or vomiting occur, drink only clear liquids until the nausea and/or vomiting subsides. Call your physician if vomiting continues.  Special Instructions/Symptoms: Your throat may feel dry or sore from the anesthesia or the breathing tube placed in your throat during surgery. If this causes discomfort, gargle with warm salt water. The discomfort should disappear within 24 hours.  If you had a scopolamine patch placed behind your ear for the management of post- operative nausea and/or vomiting:  1. The medication in the patch is effective for 72 hours, after which it should be removed.  Wrap patch in a tissue and discard in the trash. Wash hands thoroughly with soap and water. 2. You may remove the patch earlier than 72 hours if you experience unpleasant side effects which may include dry mouth, dizziness or visual disturbances. 3. Avoid touching the patch. Wash your hands with soap and water after contact with the patch.

## 2020-06-11 NOTE — Anesthesia Preprocedure Evaluation (Addendum)
Anesthesia Evaluation  Patient identified by MRN, date of birth, ID band Patient awake    Reviewed: Allergy & Precautions, H&P , Patient's Chart, lab work & pertinent test results  Airway Mallampati: II  TM Distance: >3 FB     Dental   Pulmonary asthma , Current Smoker,  Current smoker, 15 pack year history   breath sounds clear to auscultation       Cardiovascular hypertension, Pt. on medications  Rhythm:Regular Rate:Normal     Neuro/Psych  Headaches, negative psych ROS   GI/Hepatic Neg liver ROS, GERD  Controlled and Medicated,External and internal hemorrhoids w/ bleeding    Endo/Other  Morbid obesityBMI 40  Renal/GU negative Renal ROS  negative genitourinary   Musculoskeletal  (+) Arthritis , Osteoarthritis,    Abdominal (+) + obese,   Peds negative pediatric ROS (+)  Hematology negative hematology ROS (+) anemia ,   Anesthesia Other Findings   Reproductive/Obstetrics negative OB ROS S/p tubal ligation                            Anesthesia Physical Anesthesia Plan  ASA: III  Anesthesia Plan: General   Post-op Pain Management:    Induction: Intravenous  PONV Risk Score and Plan: 2 and Ondansetron, Dexamethasone, Midazolam, Treatment may vary due to age or medical condition and Scopolamine patch - Pre-op  Airway Management Planned: Oral ETT  Additional Equipment: None  Intra-op Plan:   Post-operative Plan: Extubation in OR  Informed Consent: I have reviewed the patients History and Physical, chart, labs and discussed the procedure including the risks, benefits and alternatives for the proposed anesthesia with the patient or authorized representative who has indicated his/her understanding and acceptance.     Dental advisory given  Plan Discussed with: CRNA and Anesthesiologist  Anesthesia Plan Comments:        Anesthesia Quick Evaluation

## 2020-06-12 ENCOUNTER — Encounter (HOSPITAL_BASED_OUTPATIENT_CLINIC_OR_DEPARTMENT_OTHER): Payer: Self-pay | Admitting: Surgery

## 2020-06-12 LAB — SURGICAL PATHOLOGY

## 2020-06-30 DIAGNOSIS — Z419 Encounter for procedure for purposes other than remedying health state, unspecified: Secondary | ICD-10-CM | POA: Diagnosis not present

## 2020-07-28 DIAGNOSIS — Z419 Encounter for procedure for purposes other than remedying health state, unspecified: Secondary | ICD-10-CM | POA: Diagnosis not present

## 2020-08-28 DIAGNOSIS — Z419 Encounter for procedure for purposes other than remedying health state, unspecified: Secondary | ICD-10-CM | POA: Diagnosis not present

## 2020-09-27 DIAGNOSIS — Z419 Encounter for procedure for purposes other than remedying health state, unspecified: Secondary | ICD-10-CM | POA: Diagnosis not present

## 2020-10-28 DIAGNOSIS — Z419 Encounter for procedure for purposes other than remedying health state, unspecified: Secondary | ICD-10-CM | POA: Diagnosis not present

## 2020-11-25 ENCOUNTER — Other Ambulatory Visit: Payer: Self-pay | Admitting: Internal Medicine

## 2020-11-25 DIAGNOSIS — J4520 Mild intermittent asthma, uncomplicated: Secondary | ICD-10-CM | POA: Diagnosis not present

## 2020-11-25 DIAGNOSIS — Z0000 Encounter for general adult medical examination without abnormal findings: Secondary | ICD-10-CM | POA: Diagnosis not present

## 2020-11-25 DIAGNOSIS — R7303 Prediabetes: Secondary | ICD-10-CM | POA: Diagnosis not present

## 2020-11-25 DIAGNOSIS — I10 Essential (primary) hypertension: Secondary | ICD-10-CM | POA: Diagnosis not present

## 2020-11-25 DIAGNOSIS — L308 Other specified dermatitis: Secondary | ICD-10-CM | POA: Diagnosis not present

## 2020-11-25 DIAGNOSIS — E559 Vitamin D deficiency, unspecified: Secondary | ICD-10-CM | POA: Diagnosis not present

## 2020-11-25 DIAGNOSIS — Z124 Encounter for screening for malignant neoplasm of cervix: Secondary | ICD-10-CM | POA: Diagnosis not present

## 2020-11-25 DIAGNOSIS — Z1239 Encounter for other screening for malignant neoplasm of breast: Secondary | ICD-10-CM | POA: Diagnosis not present

## 2020-11-26 LAB — COMPLETE METABOLIC PANEL WITH GFR
AG Ratio: 1.2 (calc) (ref 1.0–2.5)
ALT: 30 U/L — ABNORMAL HIGH (ref 6–29)
AST: 27 U/L (ref 10–30)
Albumin: 3.9 g/dL (ref 3.6–5.1)
Alkaline phosphatase (APISO): 54 U/L (ref 31–125)
BUN: 13 mg/dL (ref 7–25)
CO2: 20 mmol/L (ref 20–32)
Calcium: 8.8 mg/dL (ref 8.6–10.2)
Chloride: 108 mmol/L (ref 98–110)
Creat: 0.68 mg/dL (ref 0.50–1.10)
GFR, Est African American: 126 mL/min/{1.73_m2} (ref >=60)
GFR, Est Non African American: 109 mL/min/{1.73_m2} (ref >=60)
Globulin: 3.2 g/dL (calc) (ref 1.9–3.7)
Glucose, Bld: 93 mg/dL (ref 65–99)
Potassium: 3.9 mmol/L (ref 3.5–5.3)
Sodium: 137 mmol/L (ref 135–146)
Total Bilirubin: 0.3 mg/dL (ref 0.2–1.2)
Total Protein: 7.1 g/dL (ref 6.1–8.1)

## 2020-11-26 LAB — CBC
HCT: 33.7 % — ABNORMAL LOW (ref 35.0–45.0)
Hemoglobin: 10.7 g/dL — ABNORMAL LOW (ref 11.7–15.5)
MCH: 27.2 pg (ref 27.0–33.0)
MCHC: 31.8 g/dL — ABNORMAL LOW (ref 32.0–36.0)
MCV: 85.8 fL (ref 80.0–100.0)
MPV: 9.4 fL (ref 7.5–12.5)
Platelets: 335 10*3/uL (ref 140–400)
RBC: 3.93 10*6/uL (ref 3.80–5.10)
RDW: 16 % — ABNORMAL HIGH (ref 11.0–15.0)
WBC: 5 10*3/uL (ref 3.8–10.8)

## 2020-11-26 LAB — LIPID PANEL
Cholesterol: 155 mg/dL (ref <200)
HDL: 31 mg/dL — ABNORMAL LOW (ref >=50)
LDL Cholesterol (Calc): 109 mg/dL (calc) — ABNORMAL HIGH
Non-HDL Cholesterol (Calc): 124 mg/dL (calc) (ref <130)
Total CHOL/HDL Ratio: 5 (calc) — ABNORMAL HIGH (ref <5.0)
Triglycerides: 61 mg/dL (ref <150)

## 2020-11-26 LAB — VITAMIN D 25 HYDROXY (VIT D DEFICIENCY, FRACTURES): Vit D, 25-Hydroxy: 14 ng/mL — ABNORMAL LOW (ref 30–100)

## 2020-11-26 LAB — TSH: TSH: 1.98 mIU/L

## 2020-11-27 DIAGNOSIS — Z419 Encounter for procedure for purposes other than remedying health state, unspecified: Secondary | ICD-10-CM | POA: Diagnosis not present

## 2020-12-25 DIAGNOSIS — J302 Other seasonal allergic rhinitis: Secondary | ICD-10-CM | POA: Diagnosis not present

## 2020-12-25 DIAGNOSIS — J4520 Mild intermittent asthma, uncomplicated: Secondary | ICD-10-CM | POA: Diagnosis not present

## 2020-12-25 DIAGNOSIS — D649 Anemia, unspecified: Secondary | ICD-10-CM | POA: Diagnosis not present

## 2020-12-25 DIAGNOSIS — I10 Essential (primary) hypertension: Secondary | ICD-10-CM | POA: Diagnosis not present

## 2020-12-28 DIAGNOSIS — Z419 Encounter for procedure for purposes other than remedying health state, unspecified: Secondary | ICD-10-CM | POA: Diagnosis not present

## 2020-12-30 ENCOUNTER — Ambulatory Visit (INDEPENDENT_AMBULATORY_CARE_PROVIDER_SITE_OTHER): Payer: Medicaid Other | Admitting: Obstetrics

## 2020-12-30 ENCOUNTER — Other Ambulatory Visit: Payer: Self-pay | Admitting: Obstetrics

## 2020-12-30 ENCOUNTER — Other Ambulatory Visit: Payer: Self-pay

## 2020-12-30 ENCOUNTER — Encounter: Payer: Self-pay | Admitting: Obstetrics

## 2020-12-30 ENCOUNTER — Other Ambulatory Visit (HOSPITAL_COMMUNITY)
Admission: RE | Admit: 2020-12-30 | Discharge: 2020-12-30 | Disposition: A | Discharge status: Home / Self Care | Payer: Medicaid Other | Source: Ambulatory Visit | Attending: Obstetrics | Admitting: Obstetrics

## 2020-12-30 VITALS — BP 154/102 | HR 78 | Ht 70.0 in | Wt 278.7 lb

## 2020-12-30 DIAGNOSIS — D508 Other iron deficiency anemias: Secondary | ICD-10-CM

## 2020-12-30 DIAGNOSIS — Z01419 Encounter for gynecological examination (general) (routine) without abnormal findings: Secondary | ICD-10-CM | POA: Diagnosis not present

## 2020-12-30 DIAGNOSIS — N6314 Unspecified lump in the right breast, lower inner quadrant: Secondary | ICD-10-CM

## 2020-12-30 DIAGNOSIS — N898 Other specified noninflammatory disorders of vagina: Secondary | ICD-10-CM | POA: Diagnosis not present

## 2020-12-30 DIAGNOSIS — N939 Abnormal uterine and vaginal bleeding, unspecified: Secondary | ICD-10-CM | POA: Diagnosis not present

## 2020-12-30 DIAGNOSIS — Z113 Encounter for screening for infections with a predominantly sexual mode of transmission: Secondary | ICD-10-CM | POA: Diagnosis not present

## 2020-12-30 DIAGNOSIS — F17200 Nicotine dependence, unspecified, uncomplicated: Secondary | ICD-10-CM

## 2020-12-30 DIAGNOSIS — E569 Vitamin deficiency, unspecified: Secondary | ICD-10-CM

## 2020-12-30 DIAGNOSIS — E669 Obesity, unspecified: Secondary | ICD-10-CM

## 2020-12-30 DIAGNOSIS — I10 Essential (primary) hypertension: Secondary | ICD-10-CM

## 2020-12-30 DIAGNOSIS — J301 Allergic rhinitis due to pollen: Secondary | ICD-10-CM

## 2020-12-30 MED ORDER — RAMIPRIL 2.5 MG PO CAPS: 2.5000 mg | ORAL_CAPSULE | Freq: Two times a day (BID) | ORAL | 11 refills | Status: AC

## 2020-12-30 MED ORDER — VITAFOL ULTRA 29-0.6-0.4-200 MG PO CAPS: 1.0000 | ORAL_CAPSULE | Freq: Every day | ORAL | 4 refills | Status: AC

## 2020-12-30 MED ORDER — LORATADINE 10 MG PO TABS: 10.0000 mg | ORAL_TABLET | Freq: Every day | ORAL | 11 refills | Status: AC

## 2020-12-30 MED ORDER — FERROUS SULFATE 325 (65 FE) MG PO TABS: 325.0000 mg | ORAL_TABLET | ORAL | 5 refills | Status: AC

## 2020-12-30 NOTE — Progress Notes (Addendum)
Subjective:        Connie [name] is a 40 y.o. female here for a routine exam.  Current complaints: Heavy periods twice a month for the past year.  Also c/o right breast lump.  Personal health questionnaire:  Is patient [name], have a family history of breast and/or ovarian cancer: no Is there a family history of uterine cancer diagnosed at age < 57, gastrointestinal cancer, urinary tract cancer, family member who is a [name]: no Is the patient overweight and hypertensive, family history of diabetes, personal history of gestational diabetes, preeclampsia or PCOS: yes Is patient over 52, have PCOS,  family history of premature CHD under age 68, diabetes, smoke, have hypertension or peripheral artery disease:  no At any time, has a partner hit, kicked or otherwise hurt or frightened you?: no Over the past 2 weeks, have you felt down, depressed or hopeless?: no Over the past 2 weeks, have you felt little interest or pleasure in doing things?:no   Gynecologic History Patient's last menstrual period was 12/09/2020. Contraception: tubal ligation Last Pap: 08-01-2014. Results were: normal Last mammogram: none. Results were: none  Obstetric History OB History  Gravida Para Term Preterm AB Living  4 3 2   1 3   SAB IAB Ectopic Multiple Live Births    1     3    # Outcome Date GA Lbr Len/2nd Weight Sex Delivery Anes PTL Lv  4 Term 04/02/11 [redacted]w[redacted]d / 00:06 7 lb 2 oz (3.232 kg) M Vag-Spont EPI  LIV  3 Term 09/18/08    M    LIV     Birth Comments: + GBS  2 IAB 2006          1 Para 02/14/03    M Vag-Spont   LIV     Birth Comments: + GBS, swelling    Past Medical History:  Diagnosis Date   Arthritis    right knee right shoulder right hip oa   Asthma    Eczema    GERD (gastroesophageal reflux disease)    Headache(784.0)    History of blood transfusion 04/03/2011   no reaction   History of chlamydia    History of gonorrhea    Hypertension     Increased BMI    Pregnancy induced hypertension    previous pregnancy   Varicose veins    Wears glasses     Past Surgical History:  Procedure Laterality Date   ADENOIDECTOMY  age 66   BREAST CYST EXCISION Left 2008   CHOLECYSTECTOMY, LAPAROSCOPIC  05/2004   laparoscopic   EVALUATION UNDER ANESTHESIA WITH HEMORRHOIDECTOMY N/A 06/11/2020   Procedure: EXAM UNDER ANESTHESIA WITH HEMORRHOIDECTOMY WITH LIGATION;  Surgeon: 06/13/2020, MD;  Location: [facility];  Service: General;  Laterality: N/A;  GENERAL AND LOCAL   THERAPEUTIC ABORTION  age 50   TONSILLECTOMY  age 53   TUBAL LIGATION  04/02/2011   Procedure: POST PARTUM TUBAL LIGATION;  Surgeon: 13/07/2010, MD;  Location: [facility];  Service: Gynecology;  Laterality: Bilateral;  Bilateral post partum tubal ligation   WISDOM TOOTH EXTRACTION       Current Outpatient Medications:    albuterol (PROVENTIL HFA;VENTOLIN HFA) 108 (90 BASE) MCG/ACT inhaler, Inhale 2 puffs into the lungs every 6 (six) hours as needed. Shortness of breath, Disp: , Rfl:    AMLODIPINE BENZOATE PO, Take 10 mg by mouth daily., Disp: , Rfl:    aspirin 325 MG tablet,  Take 325 mg by mouth daily., Disp: , Rfl:    ferrous sulfate 325 (65 FE) MG tablet, Take 1 tablet (325 mg total) by mouth every other day., Disp: 60 tablet, Rfl: 5   hydrochlorothiazide (HYDRODIURIL) 25 MG tablet, Take 25 mg by mouth. Takes 330 pm daily, Disp: , Rfl:    loratadine (CLARITIN) 10 MG tablet, Take 1 tablet (10 mg total) by mouth daily., Disp: 30 tablet, Rfl: 11   potassium chloride (KLOR-CON) 10 MEQ tablet, Take 10 mEq by mouth daily as needed., Disp: , Rfl:    Prenat-Fe Poly-Methfol-FA-DHA (VITAFOL ULTRA) 29-0.6-0.4-200 MG CAPS, Take 1 capsule by mouth daily before breakfast., Disp: 90 capsule, Rfl: 4   ramipril (ALTACE) 2.5 MG capsule, Take 1 capsule (2.5 mg total) by mouth 2 (two) times daily., Disp: 60 capsule, Rfl: 11   acetaminophen (TYLENOL) 500 MG tablet, Take 1,000 mg  by mouth every 6 (six) hours as needed., Disp: , Rfl:    APPLE CIDER VINEGAR PO, Take by mouth. Goli nutrition 2 tabs daily, Disp: , Rfl:    diazepam (VALIUM) 5 MG tablet, Take 1 tablet (5 mg total) by mouth every 6 (six) hours as needed for muscle spasms (difficulty urinating)., Disp: 8 tablet, Rfl: 2   ibuprofen (ADVIL,MOTRIN) 200 MG tablet, Take 200 mg by mouth every 6 (six) hours as needed for moderate pain. Takes 800 mg total, Disp: , Rfl:    OVER THE COUNTER MEDICATION, Ashwagandi 2 tabs daily, Disp: , Rfl:    oxyCODONE (OXY IR/ROXICODONE) 5 MG immediate release tablet, Take 1-2 tablets (5-10 mg total) by mouth every 6 (six) hours as needed for moderate pain, severe pain or breakthrough pain., Disp: 40 tablet, Rfl: 0   triamcinolone cream (KENALOG) 0.5 %, Apply 1 application topically 2 (two) times daily. For ecema, Disp: , Rfl:  Allergies  Allergen Reactions   Dilaudid [Hydromorphone Hcl] Anaphylaxis and Hives    Throat swelling   Amoxicillin Swelling   Latex Hives    Social History   Tobacco Use   Smoking status: Every Day    Packs/day: 0.50    Years: 30.00    Pack years: 15.00    Types: Cigarettes   Smokeless tobacco: Current  Substance Use Topics   Alcohol use: Yes    Comment: occ    Family History  Problem Relation Age of Onset   Hypertension Mother    Diabetes Mother    Asthma Maternal Grandmother    Heart attack Maternal Grandmother    Peripheral vascular disease Maternal Grandmother    Depression Maternal Grandmother    Mental retardation Maternal Grandmother        OCD   Cancer Paternal Grandmother        lung   Heart attack Maternal Uncle    Drug abuse Maternal Uncle        crack addiction   Anesthesia problems Neg Hx    Hypotension Neg Hx    Malignant hyperthermia Neg Hx    Pseudochol deficiency Neg Hx       Review of Systems  Constitutional: negative for fatigue and weight loss Respiratory: negative for cough and wheezing Cardiovascular: negative  for chest pain, fatigue and palpitations Gastrointestinal: negative for abdominal pain and change in bowel habits Musculoskeletal:negative for myalgias Neurological: negative for gait problems and tremors Behavioral/Psych: negative for abusive relationship, depression Endocrine: negative for temperature intolerance    Genitourinary:positive for vaginal discharge and periods twice a month.  negative for abnormal menstrual periods, genital  lesions, hot flashes, sexual problems Integument/breast: positive for right breast lump.  Negative for breast tenderness, nipple discharge and skin lesion(s)    Objective:       BP (!) 154/102   Pulse 78   Ht 5\' 10"  (1.778 m)   Wt 278 lb 11.2 oz (126.4 kg)   LMP 12/09/2020   BMI 39.99 kg/m  General:   Alert and no distress  Skin:   no rash or abnormalities  Lungs:   clear to auscultation bilaterally  Heart:   regular rate and rhythm, S1, S2 normal, no murmur, click, rub or gallop  Breasts:   normal without suspicious masses, skin or nipple changes or axillary nodes  Abdomen:  normal findings: no organomegaly, soft, non-tender and no hernia  Pelvis:  External genitalia: normal general appearance Urinary system: urethral meatus normal and bladder without fullness, nontender Vaginal: normal without tenderness, induration or masses Cervix: normal appearance Adnexa: normal bimanual exam Uterus: anteverted and non-tender, normal size   Lab Review Urine pregnancy test Labs reviewed yes Radiologic studies reviewed yes  I have spent a total of 20 minutes of face-to-face time, excluding clinical staff time, reviewing notes and preparing to see patient, ordering tests and/or medications, and counseling the patient.   Assessment:   1. Encounter for routine gynecological examination with Papanicolaou smear of cervix Rx: - Cytology - PAP( [place])  2. Abnormal uterine bleeding (AUB) Rx: - 12/11/2020 PELVIC COMPLETE WITH TRANSVAGINAL; Future  3.  Vaginal discharge Rx: - Cervicovaginal ancillary only( [facility])  4. Screening for STD (sexually transmitted disease) Rx: - Hepatitis B surface antigen - Hepatitis C antibody - HIV Antibody (routine testing w rflx) - RPR  5. Breast lump on right side at 5 o'clock position Rx: - MM DIAG BREAST TOMO BILATERAL; Future  6. HTN (hypertension), benign Rx: - ramipril (ALTACE) 2.5 MG capsule; Take 1 capsule (2.5 mg total) by mouth 2 (two) times daily.  Dispense: 60 capsule; Refill: 11  7. Iron deficiency anemia secondary to inadequate dietary iron intake Rx: - ferrous sulfate 325 (65 FE) MG tablet; Take 1 tablet (325 mg total) by mouth every other day.  Dispense: 60 tablet; Refill: 5  8. Vitamin deficiency Rx: - Prenat-Fe Poly-Methfol-FA-DHA (VITAFOL ULTRA) 29-0.6-0.4-200 MG CAPS; Take 1 capsule by mouth daily before breakfast.  Dispense: 90 capsule; Refill: 4  9. Obesity (BMI 35.0-39.9 without comorbidity) - weight reduction with the aid of dietary changes, exercise and behavioral modification recommended  10. Seasonal allergic rhinitis due to pollen Rx: - loratadine (CLARITIN) 10 MG tablet; Take 1 tablet (10 mg total) by mouth daily.  Dispense: 30 tablet; Refill: 11  11. Tobacco dependence - cessation with the aid of medication and behavioral modification recommended     Plan:    Education reviewed: calcium supplements, depression evaluation, low fat, low cholesterol diet, safe sex/STD prevention, self breast exams, smoking cessation, and weight bearing exercise. Mammogram ordered.   Meds ordered this encounter  Medications   ramipril (ALTACE) 2.5 MG capsule    Sig: Take 1 capsule (2.5 mg total) by mouth 2 (two) times daily.    Dispense:  60 capsule    Refill:  11   loratadine (CLARITIN) 10 MG tablet    Sig: Take 1 tablet (10 mg total) by mouth daily.    Dispense:  30 tablet    Refill:  11   ferrous sulfate 325 (65 FE) MG tablet    Sig: Take 1 tablet (325 mg total)  by  mouth every other day.    Dispense:  60 tablet    Refill:  5   Prenat-Fe Poly-Methfol-FA-DHA (VITAFOL ULTRA) 29-0.6-0.4-200 MG CAPS    Sig: Take 1 capsule by mouth daily before breakfast.    Dispense:  90 capsule    Refill:  4   Orders Placed This Encounter  Procedures   MM DIAG BREAST TOMO BILATERAL    [facility] 09/24/10 BCG/Breast mass, right breast, 5 o'clock position/NO NEEDS NO BREAST SXS/ TOMO LC W/KIM @ [facility]    Standing Status:   Future    Standing Expiration Date:   12/30/2021    Order Specific Question:   Reason for Exam (SYMPTOM  OR DIAGNOSIS REQUIRED)    Answer:   Breast mass, right breast, 5 o'clock position, soft, mobile, mildly tender    Order Specific Question:   Is the patient pregnant?    Answer:   No    Order Specific Question:   Preferred imaging location?    Answer:   [facility]   US PELVIC COMPLETE WITH TRANSVAGINAL    Standing Status:   Future    Standing Expiration Date:   12/30/2021    Order Specific Question:   Reason for Exam (SYMPTOM  OR DIAGNOSIS REQUIRED)    Answer:   AUB.  2 periods a month.    Order Specific Question:   Preferred imaging location?    Answer:   [facility]   Hepatitis B surface antigen   Hepatitis C antibody   HIV Antibody (routine testing w rflx)   RPR      Brock BadHarper, Sari Cogan A, MD 12/30/2020 11:01 AM

## 2020-12-30 NOTE — Progress Notes (Signed)
NGYN pt presents annual, pap, and STD's. Pt c/o having cycles every 2 weeks some months.  Elevated BP asymptomatic -Pt taking BP meds daily

## 2020-12-31 LAB — RPR: RPR Ser Ql: NONREACTIVE

## 2020-12-31 LAB — CERVICOVAGINAL ANCILLARY ONLY
Bacterial Vaginitis (gardnerella): NEGATIVE
Candida Glabrata: NEGATIVE
Candida Vaginitis: NEGATIVE
Chlamydia: NEGATIVE
Comment: NEGATIVE
Comment: NEGATIVE
Comment: NEGATIVE
Comment: NEGATIVE
Comment: NEGATIVE
Comment: NORMAL
Neisseria Gonorrhea: NEGATIVE
Trichomonas: NEGATIVE

## 2020-12-31 LAB — HIV ANTIBODY (ROUTINE TESTING W REFLEX): HIV Screen 4th Generation wRfx: NONREACTIVE

## 2020-12-31 LAB — HEPATITIS B SURFACE ANTIGEN: Hepatitis B Surface Ag: NEGATIVE

## 2020-12-31 LAB — HEPATITIS C ANTIBODY: Hep C Virus Ab: <0.1 s/co ratio (ref 0.0–0.9)

## 2021-01-03 LAB — CYTOLOGY - PAP
Comment: NEGATIVE
Diagnosis: NEGATIVE
High risk HPV: NEGATIVE

## 2021-01-11 ENCOUNTER — Ambulatory Visit
Admission: RE | Admit: 2021-01-11 | Discharge: 2021-01-11 | Disposition: A | Discharge status: Home / Self Care | Payer: Medicaid Other | Source: Ambulatory Visit | Attending: Obstetrics | Admitting: Obstetrics

## 2021-01-11 ENCOUNTER — Other Ambulatory Visit: Payer: Self-pay

## 2021-01-11 DIAGNOSIS — I10 Essential (primary) hypertension: Secondary | ICD-10-CM | POA: Diagnosis not present

## 2021-01-11 DIAGNOSIS — N939 Abnormal uterine and vaginal bleeding, unspecified: Secondary | ICD-10-CM | POA: Diagnosis not present

## 2021-01-11 DIAGNOSIS — Z6841 Body Mass Index (BMI) 40.0 and over, adult: Secondary | ICD-10-CM | POA: Diagnosis not present

## 2021-01-11 DIAGNOSIS — R7303 Prediabetes: Secondary | ICD-10-CM | POA: Diagnosis not present

## 2021-01-11 DIAGNOSIS — J4520 Mild intermittent asthma, uncomplicated: Secondary | ICD-10-CM | POA: Diagnosis not present

## 2021-01-16 ENCOUNTER — Ambulatory Visit
Admission: RE | Admit: 2021-01-16 | Discharge: 2021-01-16 | Disposition: A | Discharge status: Home / Self Care | Payer: Medicaid Other | Source: Ambulatory Visit | Attending: Obstetrics | Admitting: Obstetrics

## 2021-01-16 ENCOUNTER — Other Ambulatory Visit: Payer: Self-pay

## 2021-01-16 DIAGNOSIS — N6314 Unspecified lump in the right breast, lower inner quadrant: Secondary | ICD-10-CM

## 2021-01-18 ENCOUNTER — Other Ambulatory Visit: Payer: Self-pay | Admitting: Obstetrics

## 2021-01-18 ENCOUNTER — Ambulatory Visit (INDEPENDENT_AMBULATORY_CARE_PROVIDER_SITE_OTHER): Payer: Medicaid Other | Admitting: Obstetrics

## 2021-01-18 ENCOUNTER — Encounter: Payer: Self-pay | Admitting: Obstetrics

## 2021-01-18 ENCOUNTER — Other Ambulatory Visit: Payer: Self-pay

## 2021-01-18 VITALS — BP 129/89 | HR 71 | Ht 70.0 in | Wt 277.8 lb

## 2021-01-18 DIAGNOSIS — N939 Abnormal uterine and vaginal bleeding, unspecified: Secondary | ICD-10-CM | POA: Diagnosis not present

## 2021-01-18 DIAGNOSIS — N6314 Unspecified lump in the right breast, lower inner quadrant: Secondary | ICD-10-CM | POA: Diagnosis not present

## 2021-01-18 DIAGNOSIS — D508 Other iron deficiency anemias: Secondary | ICD-10-CM | POA: Diagnosis not present

## 2021-01-18 DIAGNOSIS — I10 Essential (primary) hypertension: Secondary | ICD-10-CM

## 2021-01-18 DIAGNOSIS — E669 Obesity, unspecified: Secondary | ICD-10-CM | POA: Diagnosis not present

## 2021-01-18 DIAGNOSIS — F17200 Nicotine dependence, unspecified, uncomplicated: Secondary | ICD-10-CM

## 2021-01-18 MED ORDER — NORETHINDRONE ACETATE 5 MG PO TABS
5.0000 mg | ORAL_TABLET | Freq: Every day | ORAL | 0 refills | Status: DC
Start: 2021-01-18 — End: 2021-02-10

## 2021-01-18 NOTE — Progress Notes (Signed)
2 week follow up. Taking weight loss med/ injection, "Intuitive", not found in med database.

## 2021-01-18 NOTE — Progress Notes (Signed)
Patient ID: Connie [name], female   DOB: 1981-05-15, 40 y.o.   MRN: [id number]  Chief Complaint  Patient presents with   Follow-up    HPI Connie [name] is a 40 y.o. female.  Complains of 2 periods a month for the past year.  Contraception:  Tubal Ligation. HPI  Past Medical History:  Diagnosis Date   Arthritis    right knee right shoulder right hip oa   Asthma    Eczema    GERD (gastroesophageal reflux disease)    Headache(784.0)    History of blood transfusion 04/03/2011   no reaction   History of chlamydia    History of gonorrhea    Hypertension    Increased BMI    Pregnancy induced hypertension    previous pregnancy   Varicose veins    Wears glasses     Past Surgical History:  Procedure Laterality Date   ADENOIDECTOMY  age 28   BREAST CYST EXCISION Left 2008   CHOLECYSTECTOMY, LAPAROSCOPIC  05/2004   laparoscopic   EVALUATION UNDER ANESTHESIA WITH HEMORRHOIDECTOMY N/A 06/11/2020   Procedure: EXAM UNDER ANESTHESIA WITH HEMORRHOIDECTOMY WITH LIGATION;  Surgeon: Karie Soda, MD;  Location: [facility];  Service: General;  Laterality: N/A;  GENERAL AND LOCAL   THERAPEUTIC ABORTION  age 34   TONSILLECTOMY  age 71   TUBAL LIGATION  04/02/2011   Procedure: POST PARTUM TUBAL LIGATION;  Surgeon: Janine Limbo, MD;  Location: [facility];  Service: Gynecology;  Laterality: Bilateral;  Bilateral post partum tubal ligation   WISDOM TOOTH EXTRACTION      Family History  Problem Relation Age of Onset   Hypertension Mother    Diabetes Mother    Asthma Maternal Grandmother    Heart attack Maternal Grandmother    Peripheral vascular disease Maternal Grandmother    Depression Maternal Grandmother    Mental retardation Maternal Grandmother        OCD   Cancer Paternal Grandmother        lung   Heart attack Maternal Uncle    Drug abuse Maternal Uncle        crack addiction   Anesthesia problems Neg Hx    Hypotension Neg Hx    Malignant hyperthermia Neg Hx     Pseudochol deficiency Neg Hx     Social History Social History   Tobacco Use   Smoking status: Every Day    Packs/day: 0.50    Years: 30.00    Pack years: 15.00    Types: Cigarettes   Smokeless tobacco: Current  Vaping Use   Vaping Use: Never used  Substance Use Topics   Alcohol use: Yes    Comment: occ   Drug use: No    Allergies  Allergen Reactions   Dilaudid [Hydromorphone Hcl] Anaphylaxis and Hives    Throat swelling   Amoxicillin Swelling   Latex Hives    Current Outpatient Medications  Medication Sig Dispense Refill   acetaminophen (TYLENOL) 500 MG tablet Take 1,000 mg by mouth every 6 (six) hours as needed.     albuterol (PROVENTIL HFA;VENTOLIN HFA) 108 (90 BASE) MCG/ACT inhaler Inhale 2 puffs into the lungs every 6 (six) hours as needed. Shortness of breath     AMLODIPINE BENZOATE PO Take 10 mg by mouth daily.     APPLE CIDER VINEGAR PO Take by mouth. Goli nutrition 2 tabs daily     aspirin 325 MG tablet Take 325 mg by mouth daily.  diazepam (VALIUM) 5 MG tablet Take 1 tablet (5 mg total) by mouth every 6 (six) hours as needed for muscle spasms (difficulty urinating). 8 tablet 2   ferrous sulfate 325 (65 FE) MG tablet Take 1 tablet (325 mg total) by mouth every other day. 60 tablet 5   hydrochlorothiazide (HYDRODIURIL) 25 MG tablet Take 25 mg by mouth. Takes 330 pm daily     ibuprofen (ADVIL,MOTRIN) 200 MG tablet Take 200 mg by mouth every 6 (six) hours as needed for moderate pain. Takes 800 mg total     loratadine (CLARITIN) 10 MG tablet Take 1 tablet (10 mg total) by mouth daily. 30 tablet 11   norethindrone (AYGESTIN) 5 MG tablet Take 1 tablet (5 mg total) by mouth daily. 30 tablet 0   OVER THE COUNTER MEDICATION Ashwagandi 2 tabs daily     oxyCODONE (OXY IR/ROXICODONE) 5 MG immediate release tablet Take 1-2 tablets (5-10 mg total) by mouth every 6 (six) hours as needed for moderate pain, severe pain or breakthrough pain. 40 tablet 0   potassium chloride  (KLOR-CON) 10 MEQ tablet Take 10 mEq by mouth daily as needed.     Prenat-Fe Poly-Methfol-FA-DHA (VITAFOL ULTRA) 29-0.6-0.4-200 MG CAPS Take 1 capsule by mouth daily before breakfast. 90 capsule 4   ramipril (ALTACE) 2.5 MG capsule Take 1 capsule (2.5 mg total) by mouth 2 (two) times daily. 60 capsule 11   triamcinolone cream (KENALOG) 0.5 % Apply 1 application topically 2 (two) times daily. For ecema     No current facility-administered medications for this visit.    Review of Systems Review of Systems Constitutional: negative for fatigue and weight loss Respiratory: negative for cough and wheezing Cardiovascular: negative for chest pain, fatigue and palpitations Gastrointestinal: negative for abdominal pain and change in bowel habits Genitourinary:positive for having 2 periods a month Integument/breast: negative for nipple discharge Musculoskeletal:negative for myalgias Neurological: negative for gait problems and tremors Behavioral/Psych: negative for abusive relationship, depression Endocrine: negative for temperature intolerance      Blood pressure 129/89, pulse 71, height 5\' 10"  (1.778 m), weight 277 lb 12.8 oz (126 kg).  Physical Exam Physical Exam General:   Alert and no distress  Skin:   no rash or abnormalities  Lungs:   clear to auscultation bilaterally  Heart:   regular rate and rhythm, S1, S2 normal, no murmur, click, rub or gallop  The remainder of the physical exam deferred due to yhe type of encounter  I have spent a total of 15 minutes of face-to-face time, excluding clinical staff time, reviewing notes and preparing to see patient, ordering tests and/or medications, and counseling the patient.   Data Reviewed Ultrasound Mammogram  PELVIC COMPLETE WITH TRANSVAGINAL (Accession [place]) (Order [id number]) Imaging Date: 01/11/2021 Department: [facility] Released By: 01/13/2021, NT Authorizing: Durward Mallard, MD    Exam Status  Status  Final [99]   PACS [facility] Image Link   Show images for Brock Bad PELVIC COMPLETE WITH TRANSVAGINAL Study Result  Narrative & Impression  CLINICAL DATA:  Abnormal uterine bleeding, having 2 menstrual periods per month, LMP 01/06/2021. G4P3   EXAM: TRANSABDOMINAL AND TRANSVAGINAL ULTRASOUND OF PELVIS   TECHNIQUE: Both transabdominal and transvaginal ultrasound examinations of the pelvis were performed. Transabdominal technique was performed for global imaging of the pelvis including uterus, ovaries, adnexal regions, and pelvic cul-de-sac. It was necessary to proceed with endovaginal exam following the transabdominal exam to visualize the endometrium and ovaries.   COMPARISON:  None   FINDINGS: Uterus   Measurements: 10.2 x 5.0 x 6.1 cm = volume: 163 mL. Retroflexed. Heterogeneous myometrium. No focal mass.   Endometrium   Thickness: 3 mm.  No endometrial fluid or focal abnormalities   Right ovary   Measurements: 3.3 x 1.9 x 2.4 cm = volume: 7.8 mL. Normal morphology without mass   Left ovary   Measurements: 2.8 x 1.8 x 2.5 cm = volume: 6.8 mL. Normal morphology without mass   Other findings   Small elongated cystic structure adjacent to LEFT ovary 8 x 9 x 21 mm, does not move separately from the LEFT ovary with transducer pressure, either representing a short segment of hydrosalpinx or an exophytic cyst. No additional adnexal masses.   IMPRESSION: Questionable small segment of hydrosalpinx adjacent to LEFT ovary versus exophytic ovarian cyst.   Remainder of exam normal.     Electronically Signed   By: Ulyses Southward M.D.   On: 01/11/2021 09:08        MM DIAG BREAST TOMO BILATERAL (Accession [id number]) (Order [id number]) Imaging Date: 01/16/2021 Department: [facility] Released By: Lamont Snowball Authorizing: Brock Bad, MD   Exam Status  Status  Final [99]   PACS [facility] Image Link    Show images for MM DIAG BREAST TOMO BILATERAL Study Result  Narrative & Impression  CLINICAL DATA:  Palpable lump in the right breast.   EXAM: DIGITAL DIAGNOSTIC BILATERAL MAMMOGRAM WITH TOMOSYNTHESIS AND CAD; ULTRASOUND RIGHT BREAST LIMITED   TECHNIQUE: Bilateral digital diagnostic mammography and breast tomosynthesis was performed. The images were evaluated with computer-aided detection.; Targeted ultrasound examination of the right breast was performed   COMPARISON:  None.   ACR Breast Density Category b: There are scattered areas of fibroglandular density.   FINDINGS: No suspicious masses, calcifications, or distortion are identified in either breast.   On physical exam, no suspicious lumps are identified.   Targeted ultrasound is performed, showing no sonographic correlate for the patient's palpable lump.   IMPRESSION: No mammographic or sonographic evidence of malignancy.   RECOMMENDATION: Treatment of the patient's symptoms should be based on clinical and physical exam given lack of imaging findings. Recommend annual screening mammography.   I have discussed the findings and recommendations with the patient. If applicable, a reminder letter will be sent to the patient regarding the next appointment.   BI-RADS CATEGORY  2: Benign.     Electronically Signed   By: Gerome Sam III M.D.   On: 01/16/2021 12:34        Assessment     1. Abnormal uterine bleeding (AUB) - ultrasound WNLL's - Aygestin 5 mg po daily x 30 days - needs Endometrial Biopsy  2. Breast lump on right side at 5 o'clock position - diagnostic mammogram WNL's  3. HTN (hypertension), benign - referred to internal medicine  4. Iron deficiency anemia secondary to inadequate dietary iron intake - managed by PCP  5. Obesity (BMI 35.0-39.9 without comorbidity) - weight reduction with the aid of dietary changes, exercise and behavioral modification recommended  6. Tobacco  dependence - cessation with the aid of medication and behavioral modification recommended    Plan   Follow up in 6 weeks for an Endometrial Biopsy    Brock Bad, MD 01/18/2021 9:29 AM

## 2021-01-28 DIAGNOSIS — Z419 Encounter for procedure for purposes other than remedying health state, unspecified: Secondary | ICD-10-CM | POA: Diagnosis not present

## 2021-02-03 ENCOUNTER — Other Ambulatory Visit: Payer: Medicaid Other

## 2021-02-10 ENCOUNTER — Other Ambulatory Visit: Payer: Self-pay | Admitting: Obstetrics

## 2021-02-10 DIAGNOSIS — N939 Abnormal uterine and vaginal bleeding, unspecified: Secondary | ICD-10-CM

## 2021-02-27 DIAGNOSIS — Z419 Encounter for procedure for purposes other than remedying health state, unspecified: Secondary | ICD-10-CM | POA: Diagnosis not present

## 2021-03-01 ENCOUNTER — Ambulatory Visit: Payer: Medicaid Other | Admitting: Obstetrics

## 2021-03-02 ENCOUNTER — Ambulatory Visit: Payer: Medicaid Other | Admitting: Obstetrics

## 2021-03-07 ENCOUNTER — Other Ambulatory Visit: Payer: Self-pay | Admitting: Obstetrics

## 2021-03-07 DIAGNOSIS — N939 Abnormal uterine and vaginal bleeding, unspecified: Secondary | ICD-10-CM

## 2021-03-30 DIAGNOSIS — Z419 Encounter for procedure for purposes other than remedying health state, unspecified: Secondary | ICD-10-CM | POA: Diagnosis not present

## 2021-04-02 ENCOUNTER — Other Ambulatory Visit: Payer: Self-pay | Admitting: Obstetrics

## 2021-04-02 ENCOUNTER — Other Ambulatory Visit: Payer: Medicaid Other | Admitting: Obstetrics

## 2021-04-02 DIAGNOSIS — N939 Abnormal uterine and vaginal bleeding, unspecified: Secondary | ICD-10-CM

## 2021-04-26 DIAGNOSIS — T63481A Toxic effect of venom of other arthropod, accidental (unintentional), initial encounter: Secondary | ICD-10-CM | POA: Diagnosis not present

## 2021-04-26 DIAGNOSIS — S40862A Insect bite (nonvenomous) of left upper arm, initial encounter: Secondary | ICD-10-CM | POA: Diagnosis not present

## 2021-04-26 DIAGNOSIS — R21 Rash and other nonspecific skin eruption: Secondary | ICD-10-CM | POA: Diagnosis not present

## 2021-04-29 DIAGNOSIS — Z419 Encounter for procedure for purposes other than remedying health state, unspecified: Secondary | ICD-10-CM | POA: Diagnosis not present

## 2021-05-01 ENCOUNTER — Other Ambulatory Visit: Payer: Self-pay | Admitting: Obstetrics

## 2021-05-01 DIAGNOSIS — N939 Abnormal uterine and vaginal bleeding, unspecified: Secondary | ICD-10-CM

## 2021-05-30 DIAGNOSIS — Z419 Encounter for procedure for purposes other than remedying health state, unspecified: Secondary | ICD-10-CM | POA: Diagnosis not present

## 2021-05-31 ENCOUNTER — Other Ambulatory Visit: Payer: Self-pay | Admitting: Obstetrics

## 2021-05-31 DIAGNOSIS — N939 Abnormal uterine and vaginal bleeding, unspecified: Secondary | ICD-10-CM

## 2021-06-23 ENCOUNTER — Other Ambulatory Visit: Payer: Self-pay | Admitting: Obstetrics

## 2021-06-23 DIAGNOSIS — N939 Abnormal uterine and vaginal bleeding, unspecified: Secondary | ICD-10-CM

## 2021-06-30 DIAGNOSIS — Z419 Encounter for procedure for purposes other than remedying health state, unspecified: Secondary | ICD-10-CM | POA: Diagnosis not present

## 2021-07-23 ENCOUNTER — Other Ambulatory Visit: Payer: Self-pay | Admitting: Obstetrics

## 2021-07-23 DIAGNOSIS — N939 Abnormal uterine and vaginal bleeding, unspecified: Secondary | ICD-10-CM

## 2021-07-28 DIAGNOSIS — Z419 Encounter for procedure for purposes other than remedying health state, unspecified: Secondary | ICD-10-CM | POA: Diagnosis not present

## 2021-08-28 ENCOUNTER — Other Ambulatory Visit: Payer: Self-pay | Admitting: Obstetrics

## 2021-08-28 DIAGNOSIS — N939 Abnormal uterine and vaginal bleeding, unspecified: Secondary | ICD-10-CM

## 2021-08-28 DIAGNOSIS — Z419 Encounter for procedure for purposes other than remedying health state, unspecified: Secondary | ICD-10-CM | POA: Diagnosis not present

## 2021-09-23 ENCOUNTER — Other Ambulatory Visit: Payer: Self-pay | Admitting: Obstetrics

## 2021-09-23 DIAGNOSIS — N939 Abnormal uterine and vaginal bleeding, unspecified: Secondary | ICD-10-CM

## 2021-09-27 DIAGNOSIS — Z419 Encounter for procedure for purposes other than remedying health state, unspecified: Secondary | ICD-10-CM | POA: Diagnosis not present

## 2021-10-11 DIAGNOSIS — F3489 Other specified persistent mood disorders: Secondary | ICD-10-CM | POA: Diagnosis not present

## 2021-10-18 DIAGNOSIS — F3489 Other specified persistent mood disorders: Secondary | ICD-10-CM | POA: Diagnosis not present

## 2021-10-24 ENCOUNTER — Other Ambulatory Visit: Payer: Self-pay | Admitting: Obstetrics

## 2021-10-24 DIAGNOSIS — N939 Abnormal uterine and vaginal bleeding, unspecified: Secondary | ICD-10-CM

## 2021-10-28 DIAGNOSIS — Z419 Encounter for procedure for purposes other than remedying health state, unspecified: Secondary | ICD-10-CM | POA: Diagnosis not present

## 2021-11-07 ENCOUNTER — Other Ambulatory Visit: Payer: Self-pay | Admitting: Obstetrics

## 2021-11-07 DIAGNOSIS — N939 Abnormal uterine and vaginal bleeding, unspecified: Secondary | ICD-10-CM

## 2021-11-08 DIAGNOSIS — F3489 Other specified persistent mood disorders: Secondary | ICD-10-CM | POA: Diagnosis not present

## 2021-11-22 DIAGNOSIS — F3489 Other specified persistent mood disorders: Secondary | ICD-10-CM | POA: Diagnosis not present

## 2021-11-27 DIAGNOSIS — Z419 Encounter for procedure for purposes other than remedying health state, unspecified: Secondary | ICD-10-CM | POA: Diagnosis not present

## 2021-12-13 DIAGNOSIS — F3489 Other specified persistent mood disorders: Secondary | ICD-10-CM | POA: Diagnosis not present

## 2021-12-20 DIAGNOSIS — F3489 Other specified persistent mood disorders: Secondary | ICD-10-CM | POA: Diagnosis not present

## 2021-12-27 ENCOUNTER — Other Ambulatory Visit: Payer: Self-pay | Admitting: Obstetrics

## 2021-12-27 DIAGNOSIS — F3489 Other specified persistent mood disorders: Secondary | ICD-10-CM | POA: Diagnosis not present

## 2021-12-27 DIAGNOSIS — I10 Essential (primary) hypertension: Secondary | ICD-10-CM

## 2021-12-28 DIAGNOSIS — Z419 Encounter for procedure for purposes other than remedying health state, unspecified: Secondary | ICD-10-CM | POA: Diagnosis not present

## 2022-01-03 DIAGNOSIS — F3489 Other specified persistent mood disorders: Secondary | ICD-10-CM | POA: Diagnosis not present

## 2022-01-10 DIAGNOSIS — F3489 Other specified persistent mood disorders: Secondary | ICD-10-CM | POA: Diagnosis not present

## 2022-01-17 DIAGNOSIS — F3489 Other specified persistent mood disorders: Secondary | ICD-10-CM | POA: Diagnosis not present

## 2022-01-24 DIAGNOSIS — F3489 Other specified persistent mood disorders: Secondary | ICD-10-CM | POA: Diagnosis not present

## 2022-01-28 DIAGNOSIS — Z419 Encounter for procedure for purposes other than remedying health state, unspecified: Secondary | ICD-10-CM | POA: Diagnosis not present

## 2022-02-01 DIAGNOSIS — F3489 Other specified persistent mood disorders: Secondary | ICD-10-CM | POA: Diagnosis not present

## 2022-02-07 DIAGNOSIS — F3489 Other specified persistent mood disorders: Secondary | ICD-10-CM | POA: Diagnosis not present

## 2022-02-14 DIAGNOSIS — F3489 Other specified persistent mood disorders: Secondary | ICD-10-CM | POA: Diagnosis not present

## 2022-02-21 DIAGNOSIS — F3489 Other specified persistent mood disorders: Secondary | ICD-10-CM | POA: Diagnosis not present

## 2022-02-27 DIAGNOSIS — Z419 Encounter for procedure for purposes other than remedying health state, unspecified: Secondary | ICD-10-CM | POA: Diagnosis not present

## 2022-02-28 DIAGNOSIS — F3489 Other specified persistent mood disorders: Secondary | ICD-10-CM | POA: Diagnosis not present

## 2022-03-14 DIAGNOSIS — F3489 Other specified persistent mood disorders: Secondary | ICD-10-CM | POA: Diagnosis not present

## 2022-03-21 DIAGNOSIS — F3489 Other specified persistent mood disorders: Secondary | ICD-10-CM | POA: Diagnosis not present

## 2022-03-28 DIAGNOSIS — F3489 Other specified persistent mood disorders: Secondary | ICD-10-CM | POA: Diagnosis not present

## 2022-03-30 DIAGNOSIS — Z419 Encounter for procedure for purposes other than remedying health state, unspecified: Secondary | ICD-10-CM | POA: Diagnosis not present

## 2022-04-04 DIAGNOSIS — F3489 Other specified persistent mood disorders: Secondary | ICD-10-CM | POA: Diagnosis not present

## 2022-04-18 DIAGNOSIS — F3489 Other specified persistent mood disorders: Secondary | ICD-10-CM | POA: Diagnosis not present

## 2022-04-25 DIAGNOSIS — F3489 Other specified persistent mood disorders: Secondary | ICD-10-CM | POA: Diagnosis not present

## 2022-04-29 DIAGNOSIS — Z419 Encounter for procedure for purposes other than remedying health state, unspecified: Secondary | ICD-10-CM | POA: Diagnosis not present

## 2022-05-02 DIAGNOSIS — F3489 Other specified persistent mood disorders: Secondary | ICD-10-CM | POA: Diagnosis not present

## 2022-05-09 DIAGNOSIS — F3489 Other specified persistent mood disorders: Secondary | ICD-10-CM | POA: Diagnosis not present

## 2022-05-16 DIAGNOSIS — F3489 Other specified persistent mood disorders: Secondary | ICD-10-CM | POA: Diagnosis not present

## 2022-05-30 DIAGNOSIS — Z419 Encounter for procedure for purposes other than remedying health state, unspecified: Secondary | ICD-10-CM | POA: Diagnosis not present

## 2022-06-06 DIAGNOSIS — F3489 Other specified persistent mood disorders: Secondary | ICD-10-CM | POA: Diagnosis not present

## 2022-06-15 DIAGNOSIS — F3489 Other specified persistent mood disorders: Secondary | ICD-10-CM | POA: Diagnosis not present

## 2022-06-20 DIAGNOSIS — F3489 Other specified persistent mood disorders: Secondary | ICD-10-CM | POA: Diagnosis not present

## 2022-06-27 DIAGNOSIS — F3489 Other specified persistent mood disorders: Secondary | ICD-10-CM | POA: Diagnosis not present

## 2022-06-30 DIAGNOSIS — Z419 Encounter for procedure for purposes other than remedying health state, unspecified: Secondary | ICD-10-CM | POA: Diagnosis not present

## 2022-07-04 DIAGNOSIS — F3489 Other specified persistent mood disorders: Secondary | ICD-10-CM | POA: Diagnosis not present

## 2022-07-11 DIAGNOSIS — F3489 Other specified persistent mood disorders: Secondary | ICD-10-CM | POA: Diagnosis not present

## 2022-07-18 DIAGNOSIS — F3489 Other specified persistent mood disorders: Secondary | ICD-10-CM | POA: Diagnosis not present

## 2022-07-25 DIAGNOSIS — F3489 Other specified persistent mood disorders: Secondary | ICD-10-CM | POA: Diagnosis not present

## 2022-07-29 DIAGNOSIS — Z419 Encounter for procedure for purposes other than remedying health state, unspecified: Secondary | ICD-10-CM | POA: Diagnosis not present

## 2022-08-01 DIAGNOSIS — F3489 Other specified persistent mood disorders: Secondary | ICD-10-CM | POA: Diagnosis not present

## 2022-08-08 DIAGNOSIS — F3489 Other specified persistent mood disorders: Secondary | ICD-10-CM | POA: Diagnosis not present

## 2022-08-15 DIAGNOSIS — F3489 Other specified persistent mood disorders: Secondary | ICD-10-CM | POA: Diagnosis not present

## 2022-08-22 DIAGNOSIS — F3489 Other specified persistent mood disorders: Secondary | ICD-10-CM | POA: Diagnosis not present

## 2022-08-29 DIAGNOSIS — Z419 Encounter for procedure for purposes other than remedying health state, unspecified: Secondary | ICD-10-CM | POA: Diagnosis not present

## 2022-09-05 DIAGNOSIS — F3489 Other specified persistent mood disorders: Secondary | ICD-10-CM | POA: Diagnosis not present

## 2022-09-12 DIAGNOSIS — F3489 Other specified persistent mood disorders: Secondary | ICD-10-CM | POA: Diagnosis not present

## 2022-09-26 DIAGNOSIS — F3489 Other specified persistent mood disorders: Secondary | ICD-10-CM | POA: Diagnosis not present

## 2022-09-28 DIAGNOSIS — Z419 Encounter for procedure for purposes other than remedying health state, unspecified: Secondary | ICD-10-CM | POA: Diagnosis not present

## 2022-10-03 DIAGNOSIS — F3489 Other specified persistent mood disorders: Secondary | ICD-10-CM | POA: Diagnosis not present

## 2022-10-04 ENCOUNTER — Other Ambulatory Visit: Payer: Self-pay | Admitting: Internal Medicine

## 2022-10-04 DIAGNOSIS — G4730 Sleep apnea, unspecified: Secondary | ICD-10-CM | POA: Diagnosis not present

## 2022-10-04 DIAGNOSIS — E559 Vitamin D deficiency, unspecified: Secondary | ICD-10-CM | POA: Diagnosis not present

## 2022-10-04 DIAGNOSIS — I10 Essential (primary) hypertension: Secondary | ICD-10-CM | POA: Diagnosis not present

## 2022-10-04 DIAGNOSIS — Z1239 Encounter for other screening for malignant neoplasm of breast: Secondary | ICD-10-CM | POA: Diagnosis not present

## 2022-10-04 DIAGNOSIS — Z0000 Encounter for general adult medical examination without abnormal findings: Secondary | ICD-10-CM | POA: Diagnosis not present

## 2022-10-04 DIAGNOSIS — R7303 Prediabetes: Secondary | ICD-10-CM | POA: Diagnosis not present

## 2022-10-04 DIAGNOSIS — J4520 Mild intermittent asthma, uncomplicated: Secondary | ICD-10-CM | POA: Diagnosis not present

## 2022-10-04 DIAGNOSIS — L2082 Flexural eczema: Secondary | ICD-10-CM | POA: Diagnosis not present

## 2022-10-05 LAB — COMPLETE METABOLIC PANEL WITH GFR
AG Ratio: 1.2 (calc) (ref 1.0–2.5)
ALT: 20 U/L (ref 6–29)
AST: 21 U/L (ref 10–30)
Albumin: 4 g/dL (ref 3.6–5.1)
Alkaline phosphatase (APISO): 54 U/L (ref 31–125)
BUN: 15 mg/dL (ref 7–25)
CO2: 19 mmol/L — ABNORMAL LOW (ref 20–32)
Calcium: 9 mg/dL (ref 8.6–10.2)
Chloride: 104 mmol/L (ref 98–110)
Creat: 0.78 mg/dL (ref 0.50–0.99)
Globulin: 3.4 g/dL (calc) (ref 1.9–3.7)
Glucose, Bld: 120 mg/dL — ABNORMAL HIGH (ref 65–99)
Potassium: 3.7 mmol/L (ref 3.5–5.3)
Sodium: 137 mmol/L (ref 135–146)
Total Bilirubin: 0.4 mg/dL (ref 0.2–1.2)
Total Protein: 7.4 g/dL (ref 6.1–8.1)
eGFR: 97 mL/min/{1.73_m2} (ref >=60)

## 2022-10-05 LAB — CBC
HCT: 33.2 % — ABNORMAL LOW (ref 35.0–45.0)
Hemoglobin: 10.6 g/dL — ABNORMAL LOW (ref 11.7–15.5)
MCH: 27.9 pg (ref 27.0–33.0)
MCHC: 31.9 g/dL — ABNORMAL LOW (ref 32.0–36.0)
MCV: 87.4 fL (ref 80.0–100.0)
MPV: 9.1 fL (ref 7.5–12.5)
Platelets: 361 10*3/uL (ref 140–400)
RBC: 3.8 10*6/uL (ref 3.80–5.10)
RDW: 15.7 % — ABNORMAL HIGH (ref 11.0–15.0)
WBC: 6.6 10*3/uL (ref 3.8–10.8)

## 2022-10-05 LAB — LIPID PANEL
Cholesterol: 159 mg/dL (ref <200)
HDL: 35 mg/dL — ABNORMAL LOW (ref >=50)
LDL Cholesterol (Calc): 106 mg/dL (calc) — ABNORMAL HIGH
Non-HDL Cholesterol (Calc): 124 mg/dL (calc) (ref <130)
Total CHOL/HDL Ratio: 4.5 (calc) (ref <5.0)
Triglycerides: 88 mg/dL (ref <150)

## 2022-10-05 LAB — VITAMIN D 25 HYDROXY (VIT D DEFICIENCY, FRACTURES): Vit D, 25-Hydroxy: 20 ng/mL — ABNORMAL LOW (ref 30–100)

## 2022-10-05 LAB — TSH: TSH: 2.08 mIU/L

## 2022-10-10 DIAGNOSIS — F3489 Other specified persistent mood disorders: Secondary | ICD-10-CM | POA: Diagnosis not present

## 2022-10-25 DIAGNOSIS — F3489 Other specified persistent mood disorders: Secondary | ICD-10-CM | POA: Diagnosis not present

## 2022-10-29 DIAGNOSIS — Z419 Encounter for procedure for purposes other than remedying health state, unspecified: Secondary | ICD-10-CM | POA: Diagnosis not present

## 2022-10-31 DIAGNOSIS — F3489 Other specified persistent mood disorders: Secondary | ICD-10-CM | POA: Diagnosis not present

## 2022-11-07 DIAGNOSIS — J4520 Mild intermittent asthma, uncomplicated: Secondary | ICD-10-CM | POA: Diagnosis not present

## 2022-11-07 DIAGNOSIS — D6489 Other specified anemias: Secondary | ICD-10-CM | POA: Diagnosis not present

## 2022-11-07 DIAGNOSIS — K5900 Constipation, unspecified: Secondary | ICD-10-CM | POA: Diagnosis not present

## 2022-11-07 DIAGNOSIS — G4730 Sleep apnea, unspecified: Secondary | ICD-10-CM | POA: Diagnosis not present

## 2022-11-07 DIAGNOSIS — I10 Essential (primary) hypertension: Secondary | ICD-10-CM | POA: Diagnosis not present

## 2022-11-07 DIAGNOSIS — R7303 Prediabetes: Secondary | ICD-10-CM | POA: Diagnosis not present

## 2022-11-22 DIAGNOSIS — F3489 Other specified persistent mood disorders: Secondary | ICD-10-CM | POA: Diagnosis not present

## 2022-11-24 ENCOUNTER — Telehealth: Payer: Self-pay

## 2022-11-24 NOTE — Telephone Encounter (Signed)
Error

## 2022-11-25 DIAGNOSIS — R7303 Prediabetes: Secondary | ICD-10-CM | POA: Diagnosis not present

## 2022-11-25 DIAGNOSIS — I10 Essential (primary) hypertension: Secondary | ICD-10-CM | POA: Diagnosis not present

## 2022-11-25 DIAGNOSIS — K047 Periapical abscess without sinus: Secondary | ICD-10-CM | POA: Diagnosis not present

## 2022-11-28 DIAGNOSIS — Z419 Encounter for procedure for purposes other than remedying health state, unspecified: Secondary | ICD-10-CM | POA: Diagnosis not present

## 2022-11-28 DIAGNOSIS — F3489 Other specified persistent mood disorders: Secondary | ICD-10-CM | POA: Diagnosis not present

## 2022-12-05 DIAGNOSIS — F3489 Other specified persistent mood disorders: Secondary | ICD-10-CM | POA: Diagnosis not present

## 2022-12-12 DIAGNOSIS — F3489 Other specified persistent mood disorders: Secondary | ICD-10-CM | POA: Diagnosis not present

## 2022-12-19 DIAGNOSIS — F3489 Other specified persistent mood disorders: Secondary | ICD-10-CM | POA: Diagnosis not present

## 2022-12-26 DIAGNOSIS — F3489 Other specified persistent mood disorders: Secondary | ICD-10-CM | POA: Diagnosis not present

## 2022-12-28 IMAGING — US US BREAST*R* LIMITED INC AXILLA
1 series · 4 of 4 positions shown · non-contrast
Comparison: None.

CLINICAL DATA: Palpable lump in the right breast.

EXAM:
DIGITAL DIAGNOSTIC BILATERAL MAMMOGRAM WITH TOMOSYNTHESIS AND CAD;
ULTRASOUND RIGHT BREAST LIMITED
TECHNIQUE: Bilateral digital diagnostic mammography and breast tomosynthesis
was performed. The images were evaluated with computer-aided
detection.; Targeted ultrasound examination of the right breast was
performed

[Series 1: us breast*right* limited inc axilla · 0.07mm/px · 4 of 4 slices shown]
[im 1/4]
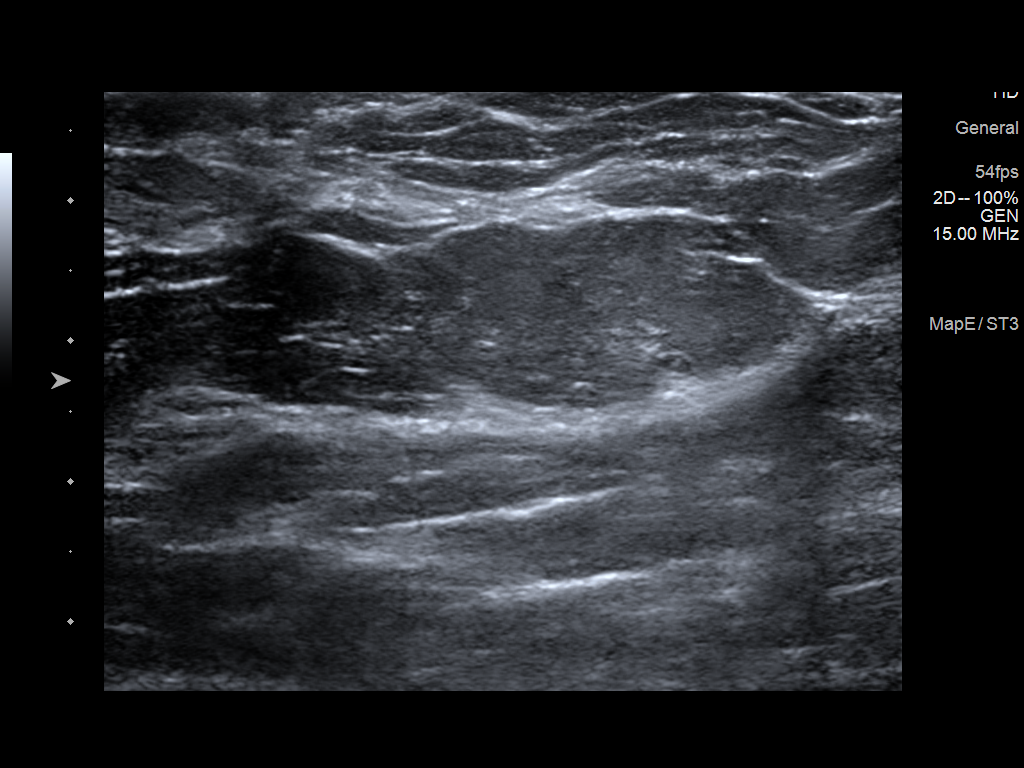
[im 2/4]
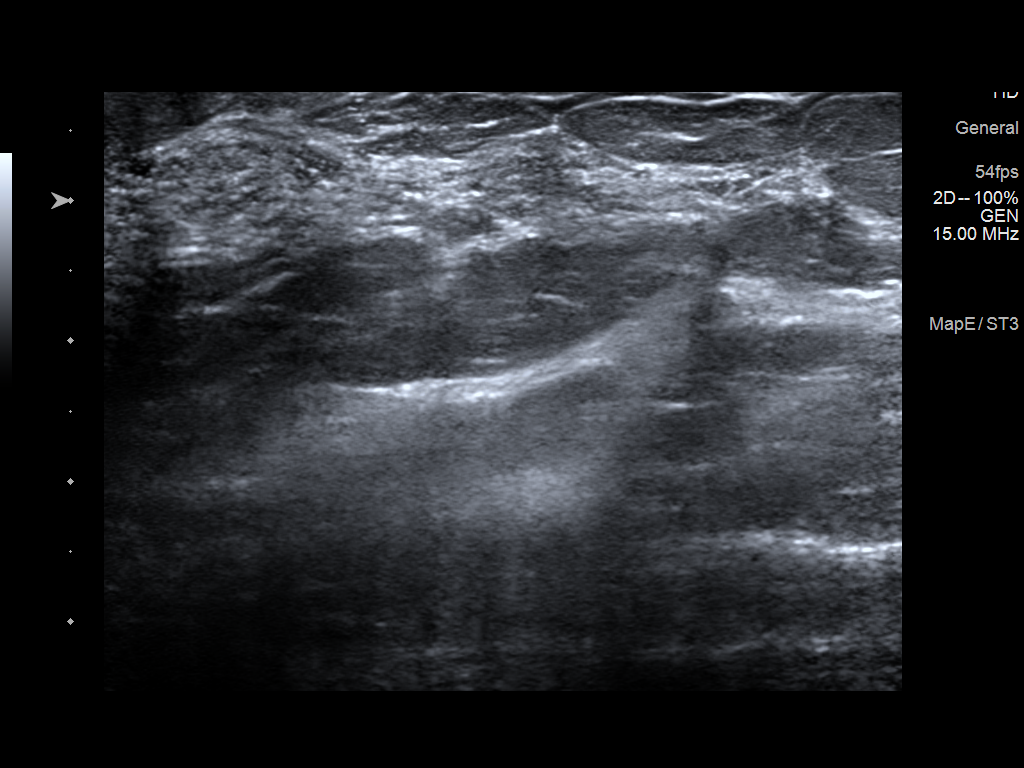
[im 3/4]
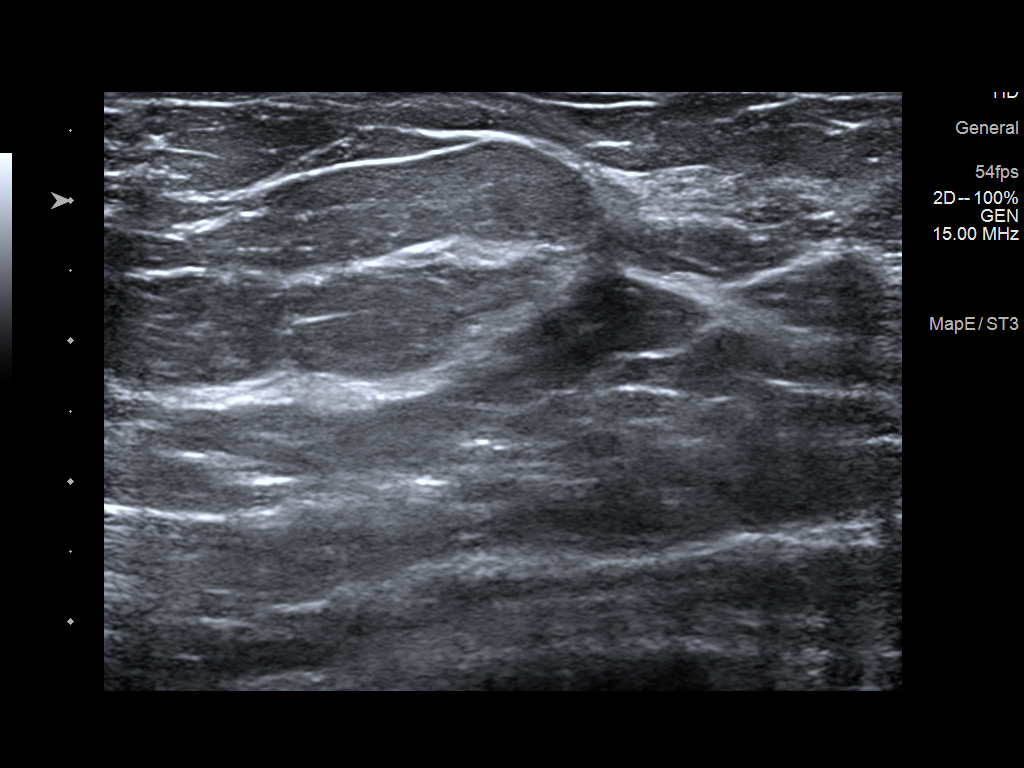
[im 4/4]
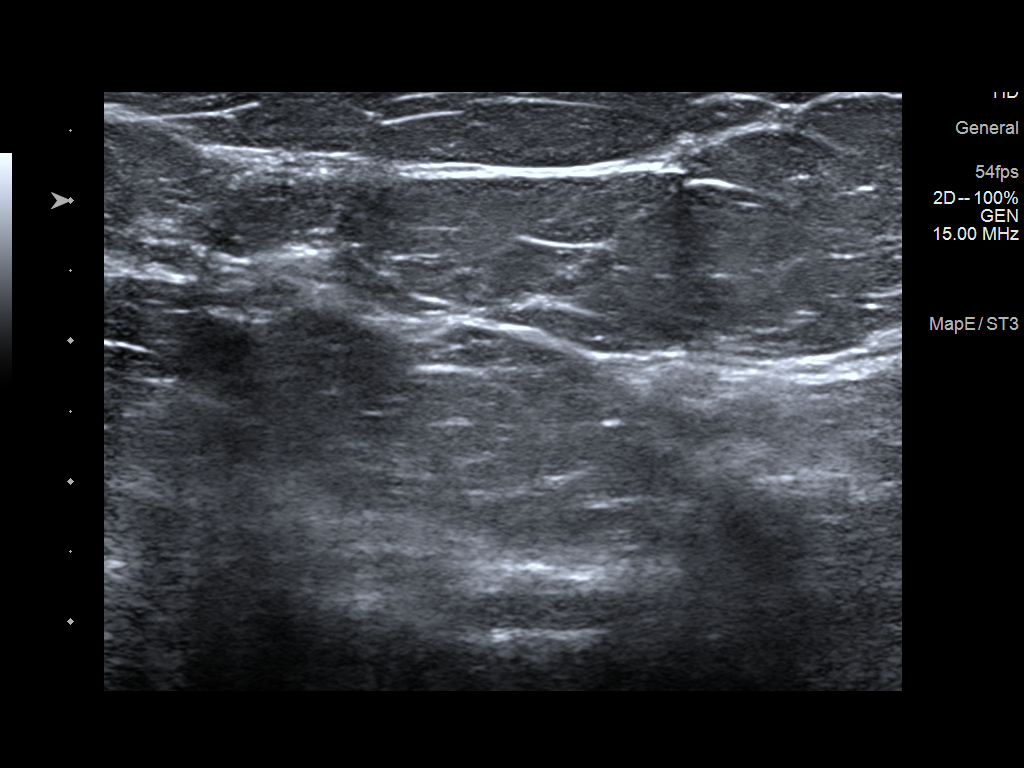

[4 of 4 positions shown; findings below may reference images not displayed]

ACR Breast Density Category b: There are scattered areas of
fibroglandular density.
FINDINGS: No suspicious masses, calcifications, or distortion are identified
in either breast.

On physical exam, no suspicious lumps are identified.

Targeted ultrasound is performed, showing no sonographic correlate
for the patient's palpable lump.
IMPRESSION: No mammographic or sonographic evidence of malignancy.

RECOMMENDATION:
Treatment of the patient's symptoms should be based on clinical and
physical exam given lack of imaging findings. Recommend annual
screening mammography.

I have discussed the findings and recommendations with the patient.
If applicable, a reminder letter will be sent to the patient
regarding the next appointment.

BI-RADS CATEGORY  2: Benign.

## 2022-12-28 IMAGING — MG DIGITAL DIAGNOSTIC BILAT W/ TOMO W/ CAD
6 of 10 series · 6 of 30 positions shown · non-contrast
Comparison: None.

CLINICAL DATA: Palpable lump in the right breast.

EXAM:
DIGITAL DIAGNOSTIC BILATERAL MAMMOGRAM WITH TOMOSYNTHESIS AND CAD;
ULTRASOUND RIGHT BREAST LIMITED
TECHNIQUE: Bilateral digital diagnostic mammography and breast tomosynthesis
was performed. The images were evaluated with computer-aided
detection.; Targeted ultrasound examination of the right breast was
performed

[R TAN synth-2D]
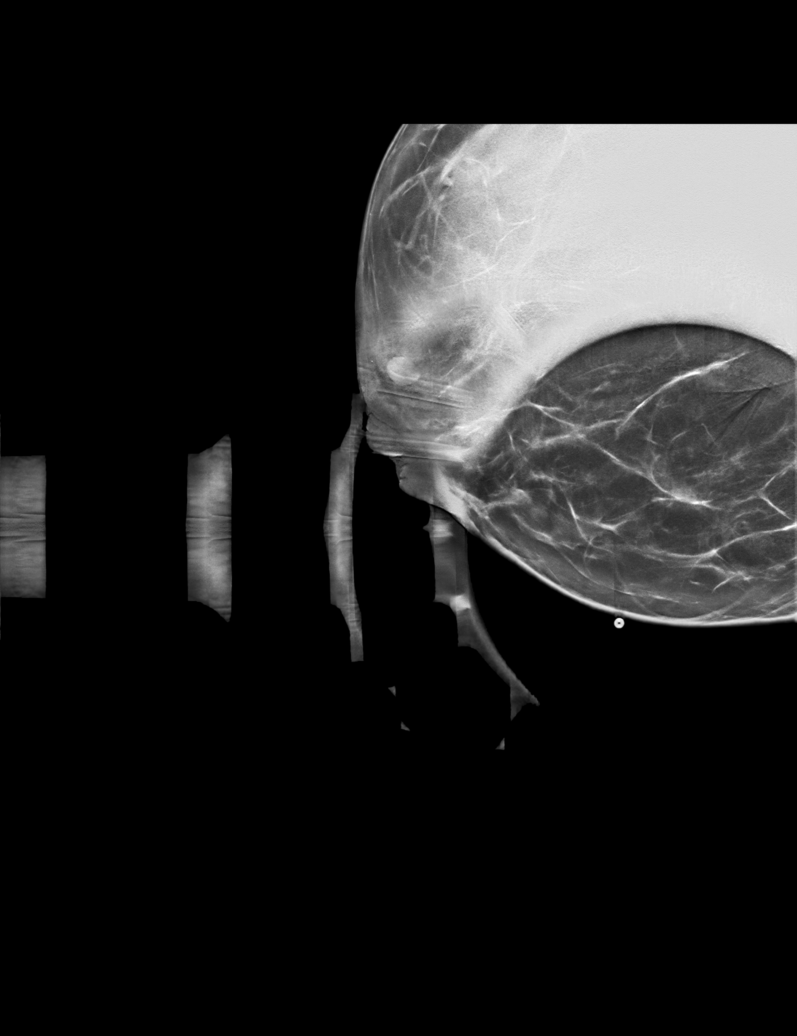

[L CC synth-2D]
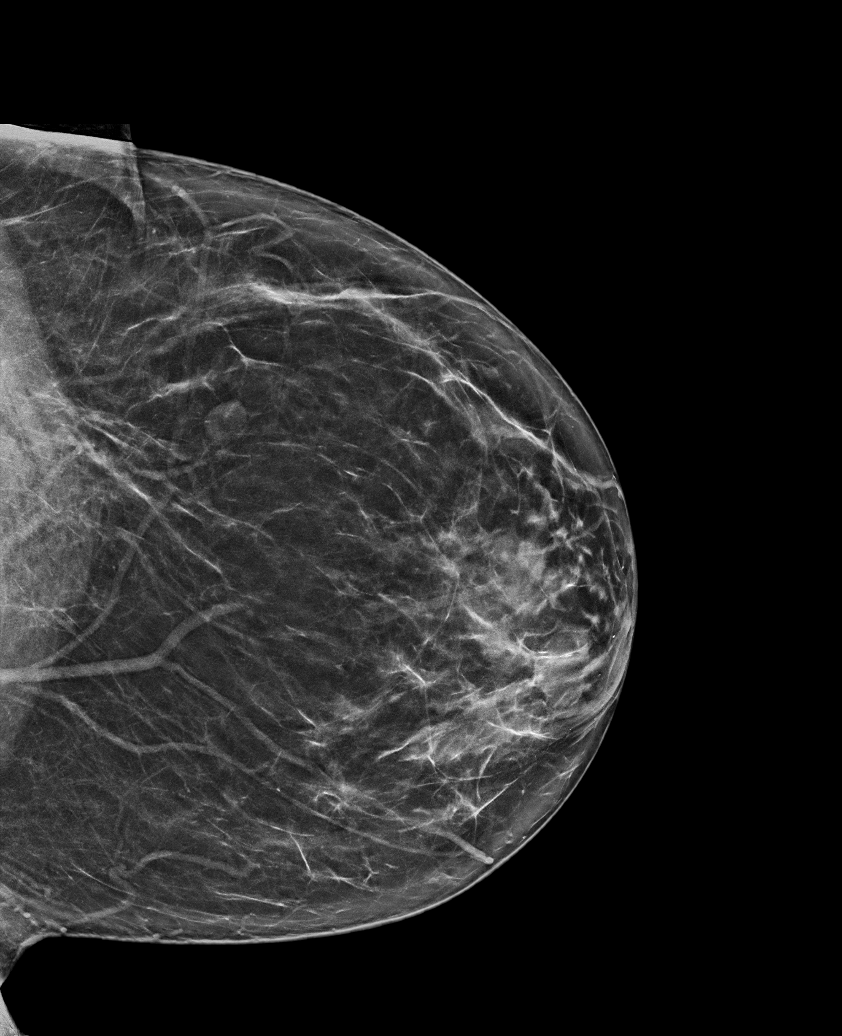

[R MLO synth-2D]
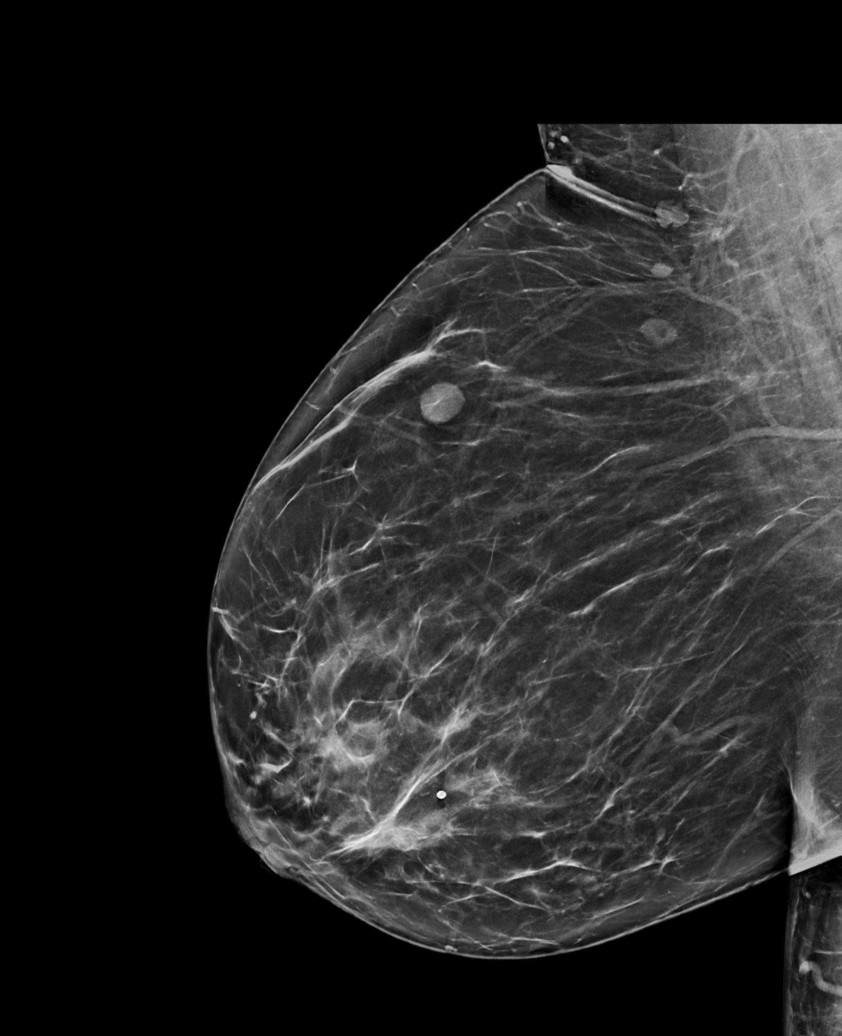

[L MLO synth-2D]
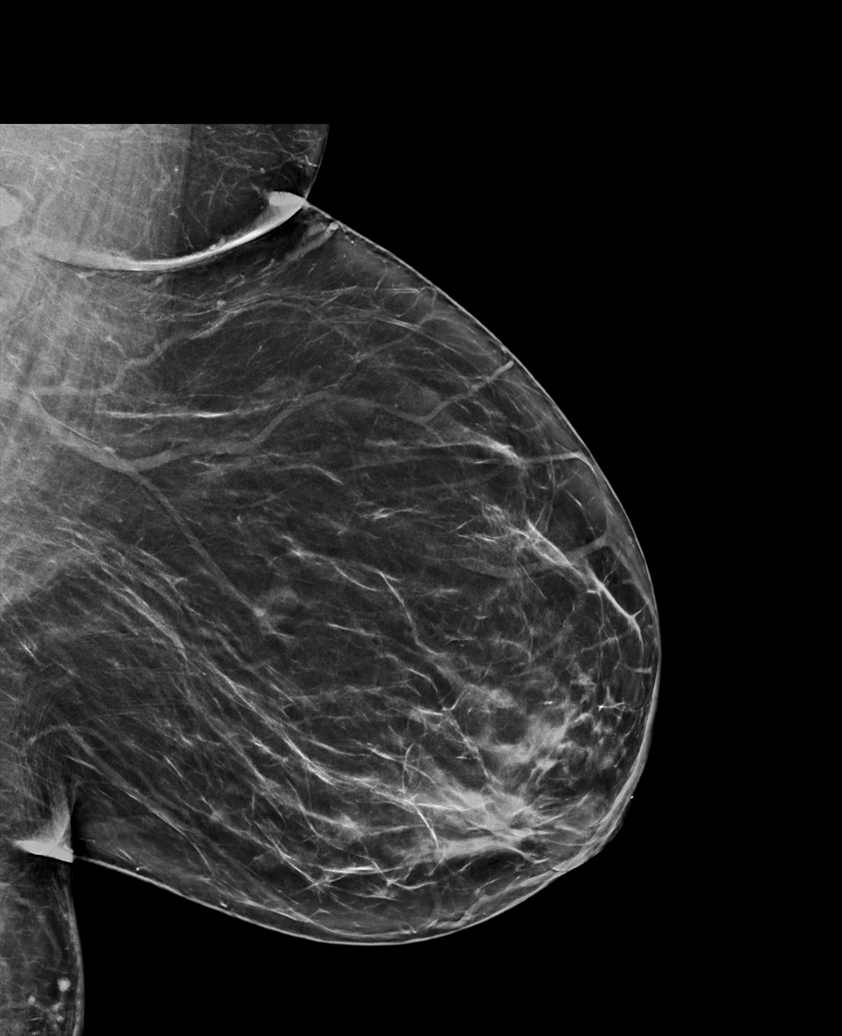

[R CC synth-2D]
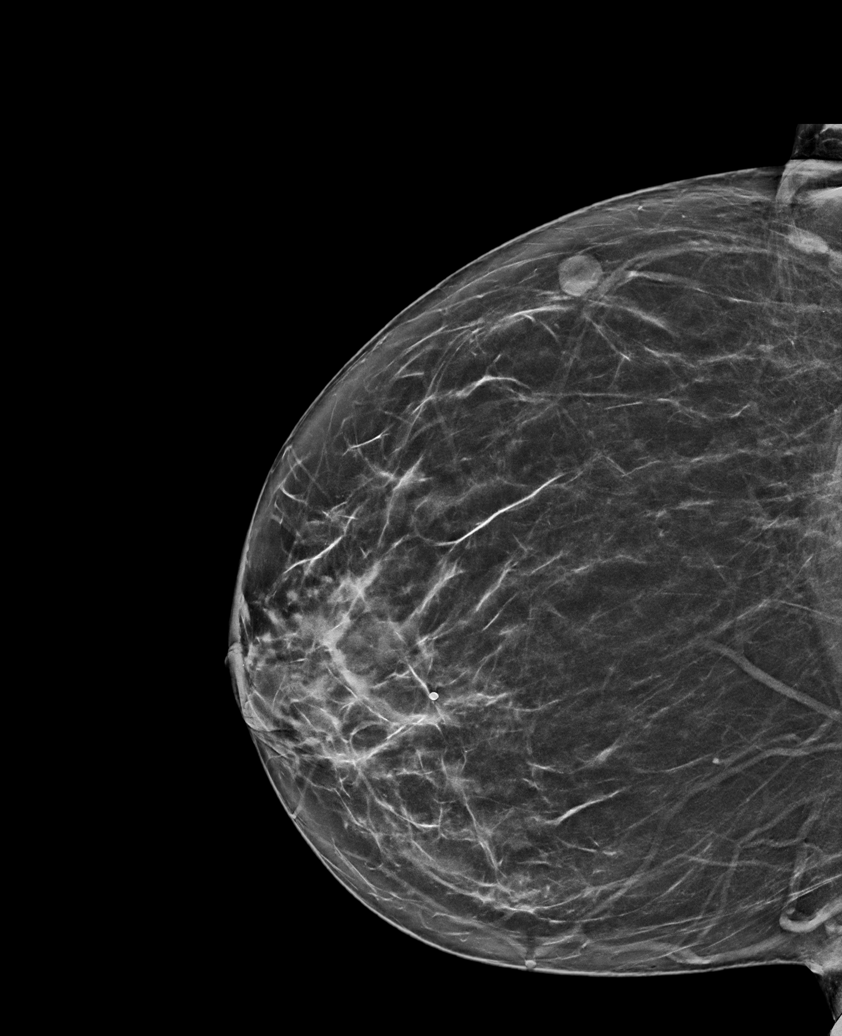

[L MLO tomo · tomo slice 41/82.0]
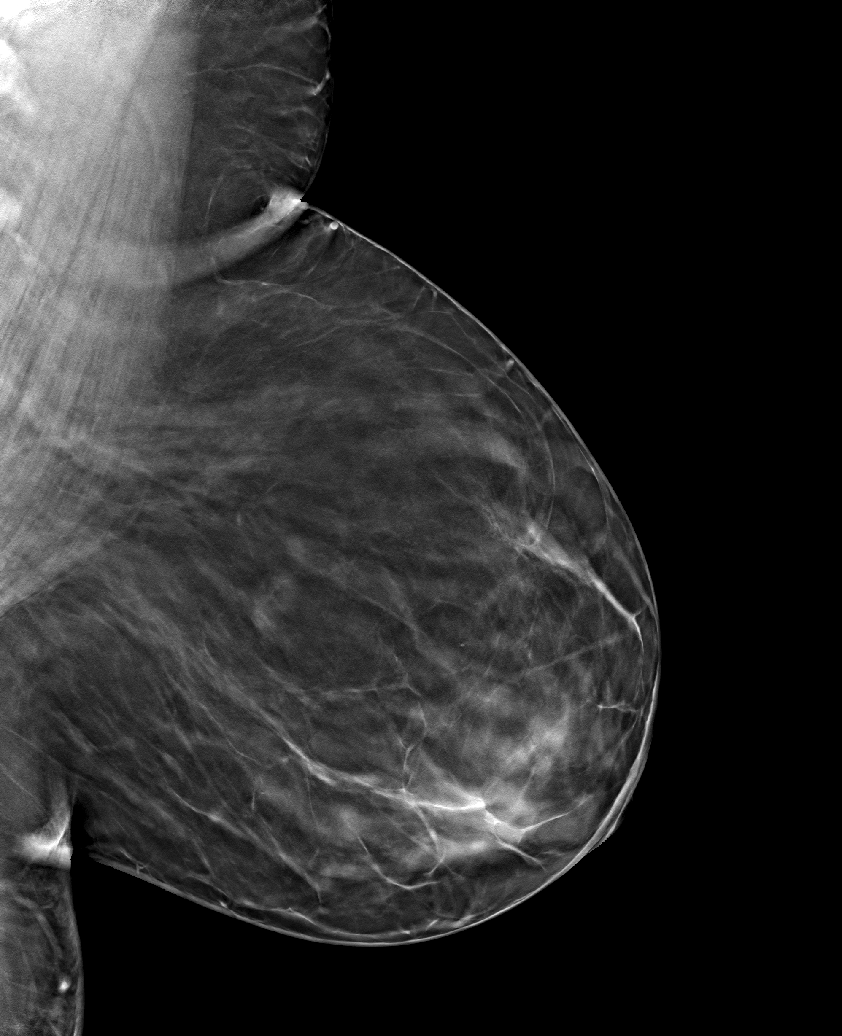

[6 of 30 positions shown; findings below may reference images not displayed]

ACR Breast Density Category b: There are scattered areas of
fibroglandular density.
FINDINGS: No suspicious masses, calcifications, or distortion are identified
in either breast.

On physical exam, no suspicious lumps are identified.

Targeted ultrasound is performed, showing no sonographic correlate
for the patient's palpable lump.
IMPRESSION: No mammographic or sonographic evidence of malignancy.

RECOMMENDATION:
Treatment of the patient's symptoms should be based on clinical and
physical exam given lack of imaging findings. Recommend annual
screening mammography.

I have discussed the findings and recommendations with the patient.
If applicable, a reminder letter will be sent to the patient
regarding the next appointment.

BI-RADS CATEGORY  2: Benign.

## 2022-12-29 DIAGNOSIS — Z419 Encounter for procedure for purposes other than remedying health state, unspecified: Secondary | ICD-10-CM | POA: Diagnosis not present

## 2023-01-09 DIAGNOSIS — F3489 Other specified persistent mood disorders: Secondary | ICD-10-CM | POA: Diagnosis not present

## 2023-01-16 DIAGNOSIS — F3489 Other specified persistent mood disorders: Secondary | ICD-10-CM | POA: Diagnosis not present

## 2023-01-23 DIAGNOSIS — F3489 Other specified persistent mood disorders: Secondary | ICD-10-CM | POA: Diagnosis not present

## 2023-01-29 DIAGNOSIS — Z419 Encounter for procedure for purposes other than remedying health state, unspecified: Secondary | ICD-10-CM | POA: Diagnosis not present

## 2023-02-06 DIAGNOSIS — F3489 Other specified persistent mood disorders: Secondary | ICD-10-CM | POA: Diagnosis not present

## 2023-02-13 DIAGNOSIS — F3489 Other specified persistent mood disorders: Secondary | ICD-10-CM | POA: Diagnosis not present

## 2023-02-20 DIAGNOSIS — F3489 Other specified persistent mood disorders: Secondary | ICD-10-CM | POA: Diagnosis not present

## 2023-02-27 DIAGNOSIS — F3489 Other specified persistent mood disorders: Secondary | ICD-10-CM | POA: Diagnosis not present

## 2023-02-28 DIAGNOSIS — Z419 Encounter for procedure for purposes other than remedying health state, unspecified: Secondary | ICD-10-CM | POA: Diagnosis not present

## 2023-02-28 DIAGNOSIS — J4520 Mild intermittent asthma, uncomplicated: Secondary | ICD-10-CM | POA: Diagnosis not present

## 2023-03-06 DIAGNOSIS — F3489 Other specified persistent mood disorders: Secondary | ICD-10-CM | POA: Diagnosis not present

## 2023-03-13 DIAGNOSIS — F3489 Other specified persistent mood disorders: Secondary | ICD-10-CM | POA: Diagnosis not present

## 2023-03-27 DIAGNOSIS — G4733 Obstructive sleep apnea (adult) (pediatric): Secondary | ICD-10-CM | POA: Diagnosis not present

## 2023-03-31 DIAGNOSIS — Z419 Encounter for procedure for purposes other than remedying health state, unspecified: Secondary | ICD-10-CM | POA: Diagnosis not present

## 2023-04-03 DIAGNOSIS — F3489 Other specified persistent mood disorders: Secondary | ICD-10-CM | POA: Diagnosis not present

## 2023-04-11 DIAGNOSIS — F3489 Other specified persistent mood disorders: Secondary | ICD-10-CM | POA: Diagnosis not present

## 2023-04-17 DIAGNOSIS — F3489 Other specified persistent mood disorders: Secondary | ICD-10-CM | POA: Diagnosis not present

## 2023-04-24 DIAGNOSIS — F3489 Other specified persistent mood disorders: Secondary | ICD-10-CM | POA: Diagnosis not present

## 2023-04-27 DIAGNOSIS — G4733 Obstructive sleep apnea (adult) (pediatric): Secondary | ICD-10-CM | POA: Diagnosis not present

## 2023-04-30 DIAGNOSIS — Z419 Encounter for procedure for purposes other than remedying health state, unspecified: Secondary | ICD-10-CM | POA: Diagnosis not present

## 2023-05-01 DIAGNOSIS — F3489 Other specified persistent mood disorders: Secondary | ICD-10-CM | POA: Diagnosis not present

## 2023-05-08 DIAGNOSIS — F3489 Other specified persistent mood disorders: Secondary | ICD-10-CM | POA: Diagnosis not present

## 2023-05-15 DIAGNOSIS — F3489 Other specified persistent mood disorders: Secondary | ICD-10-CM | POA: Diagnosis not present

## 2023-05-22 DIAGNOSIS — F3489 Other specified persistent mood disorders: Secondary | ICD-10-CM | POA: Diagnosis not present

## 2023-05-29 DIAGNOSIS — F3489 Other specified persistent mood disorders: Secondary | ICD-10-CM | POA: Diagnosis not present

## 2023-05-31 DIAGNOSIS — Z419 Encounter for procedure for purposes other than remedying health state, unspecified: Secondary | ICD-10-CM | POA: Diagnosis not present

## 2023-06-12 DIAGNOSIS — F3489 Other specified persistent mood disorders: Secondary | ICD-10-CM | POA: Diagnosis not present

## 2023-06-19 DIAGNOSIS — F3489 Other specified persistent mood disorders: Secondary | ICD-10-CM | POA: Diagnosis not present

## 2023-06-26 DIAGNOSIS — F3489 Other specified persistent mood disorders: Secondary | ICD-10-CM | POA: Diagnosis not present

## 2023-07-01 DIAGNOSIS — Z419 Encounter for procedure for purposes other than remedying health state, unspecified: Secondary | ICD-10-CM | POA: Diagnosis not present

## 2023-07-10 DIAGNOSIS — F3489 Other specified persistent mood disorders: Secondary | ICD-10-CM | POA: Diagnosis not present

## 2023-07-24 DIAGNOSIS — F3489 Other specified persistent mood disorders: Secondary | ICD-10-CM | POA: Diagnosis not present

## 2023-07-29 DIAGNOSIS — Z419 Encounter for procedure for purposes other than remedying health state, unspecified: Secondary | ICD-10-CM | POA: Diagnosis not present

## 2023-08-07 DIAGNOSIS — F3489 Other specified persistent mood disorders: Secondary | ICD-10-CM | POA: Diagnosis not present

## 2023-08-21 DIAGNOSIS — F3489 Other specified persistent mood disorders: Secondary | ICD-10-CM | POA: Diagnosis not present

## 2023-09-04 DIAGNOSIS — F3489 Other specified persistent mood disorders: Secondary | ICD-10-CM | POA: Diagnosis not present

## 2023-09-09 DIAGNOSIS — Z419 Encounter for procedure for purposes other than remedying health state, unspecified: Secondary | ICD-10-CM | POA: Diagnosis not present

## 2023-09-18 DIAGNOSIS — F3489 Other specified persistent mood disorders: Secondary | ICD-10-CM | POA: Diagnosis not present

## 2023-10-02 DIAGNOSIS — F3489 Other specified persistent mood disorders: Secondary | ICD-10-CM | POA: Diagnosis not present

## 2023-10-09 DIAGNOSIS — Z419 Encounter for procedure for purposes other than remedying health state, unspecified: Secondary | ICD-10-CM | POA: Diagnosis not present

## 2023-10-16 DIAGNOSIS — F3489 Other specified persistent mood disorders: Secondary | ICD-10-CM | POA: Diagnosis not present

## 2023-10-30 DIAGNOSIS — F3489 Other specified persistent mood disorders: Secondary | ICD-10-CM | POA: Diagnosis not present

## 2023-11-09 DIAGNOSIS — Z419 Encounter for procedure for purposes other than remedying health state, unspecified: Secondary | ICD-10-CM | POA: Diagnosis not present

## 2023-11-20 DIAGNOSIS — I10 Essential (primary) hypertension: Secondary | ICD-10-CM | POA: Diagnosis not present

## 2023-11-20 DIAGNOSIS — R7303 Prediabetes: Secondary | ICD-10-CM | POA: Diagnosis not present

## 2023-11-20 DIAGNOSIS — J4520 Mild intermittent asthma, uncomplicated: Secondary | ICD-10-CM | POA: Diagnosis not present

## 2023-11-27 DIAGNOSIS — F3489 Other specified persistent mood disorders: Secondary | ICD-10-CM | POA: Diagnosis not present

## 2023-12-04 DIAGNOSIS — F3489 Other specified persistent mood disorders: Secondary | ICD-10-CM | POA: Diagnosis not present

## 2023-12-06 DIAGNOSIS — J4520 Mild intermittent asthma, uncomplicated: Secondary | ICD-10-CM | POA: Diagnosis not present

## 2023-12-06 DIAGNOSIS — Z0000 Encounter for general adult medical examination without abnormal findings: Secondary | ICD-10-CM | POA: Diagnosis not present

## 2023-12-06 DIAGNOSIS — Z7189 Other specified counseling: Secondary | ICD-10-CM | POA: Diagnosis not present

## 2023-12-06 DIAGNOSIS — R7303 Prediabetes: Secondary | ICD-10-CM | POA: Diagnosis not present

## 2023-12-06 DIAGNOSIS — Z6841 Body Mass Index (BMI) 40.0 and over, adult: Secondary | ICD-10-CM | POA: Diagnosis not present

## 2023-12-06 DIAGNOSIS — E559 Vitamin D deficiency, unspecified: Secondary | ICD-10-CM | POA: Diagnosis not present

## 2023-12-06 DIAGNOSIS — I10 Essential (primary) hypertension: Secondary | ICD-10-CM | POA: Diagnosis not present

## 2023-12-06 DIAGNOSIS — Z1239 Encounter for other screening for malignant neoplasm of breast: Secondary | ICD-10-CM | POA: Diagnosis not present

## 2023-12-09 DIAGNOSIS — Z419 Encounter for procedure for purposes other than remedying health state, unspecified: Secondary | ICD-10-CM | POA: Diagnosis not present

## 2023-12-11 DIAGNOSIS — F3489 Other specified persistent mood disorders: Secondary | ICD-10-CM | POA: Diagnosis not present

## 2024-01-08 DIAGNOSIS — F3489 Other specified persistent mood disorders: Secondary | ICD-10-CM | POA: Diagnosis not present

## 2024-01-09 DIAGNOSIS — E669 Obesity, unspecified: Secondary | ICD-10-CM | POA: Diagnosis not present

## 2024-01-09 DIAGNOSIS — R7303 Prediabetes: Secondary | ICD-10-CM | POA: Diagnosis not present

## 2024-01-09 DIAGNOSIS — J4520 Mild intermittent asthma, uncomplicated: Secondary | ICD-10-CM | POA: Diagnosis not present

## 2024-01-09 DIAGNOSIS — Z419 Encounter for procedure for purposes other than remedying health state, unspecified: Secondary | ICD-10-CM | POA: Diagnosis not present

## 2024-01-09 DIAGNOSIS — I10 Essential (primary) hypertension: Secondary | ICD-10-CM | POA: Diagnosis not present

## 2024-01-22 DIAGNOSIS — F3489 Other specified persistent mood disorders: Secondary | ICD-10-CM | POA: Diagnosis not present

## 2024-02-05 DIAGNOSIS — F3489 Other specified persistent mood disorders: Secondary | ICD-10-CM | POA: Diagnosis not present

## 2024-02-09 DIAGNOSIS — Z419 Encounter for procedure for purposes other than remedying health state, unspecified: Secondary | ICD-10-CM | POA: Diagnosis not present

## 2024-02-16 DIAGNOSIS — I10 Essential (primary) hypertension: Secondary | ICD-10-CM | POA: Diagnosis not present

## 2024-02-16 DIAGNOSIS — Z7189 Other specified counseling: Secondary | ICD-10-CM | POA: Diagnosis not present

## 2024-02-16 DIAGNOSIS — F17210 Nicotine dependence, cigarettes, uncomplicated: Secondary | ICD-10-CM | POA: Diagnosis not present

## 2024-02-16 DIAGNOSIS — Z6841 Body Mass Index (BMI) 40.0 and over, adult: Secondary | ICD-10-CM | POA: Diagnosis not present

## 2024-02-16 DIAGNOSIS — J4520 Mild intermittent asthma, uncomplicated: Secondary | ICD-10-CM | POA: Diagnosis not present

## 2024-02-16 DIAGNOSIS — J302 Other seasonal allergic rhinitis: Secondary | ICD-10-CM | POA: Diagnosis not present

## 2024-02-16 DIAGNOSIS — R7303 Prediabetes: Secondary | ICD-10-CM | POA: Diagnosis not present

## 2024-02-19 DIAGNOSIS — F3489 Other specified persistent mood disorders: Secondary | ICD-10-CM | POA: Diagnosis not present

## 2024-03-04 DIAGNOSIS — F3489 Other specified persistent mood disorders: Secondary | ICD-10-CM | POA: Diagnosis not present

## 2024-04-04 ENCOUNTER — Emergency Department (HOSPITAL_BASED_OUTPATIENT_CLINIC_OR_DEPARTMENT_OTHER): Payer: Worker's Compensation

## 2024-04-04 ENCOUNTER — Emergency Department (HOSPITAL_BASED_OUTPATIENT_CLINIC_OR_DEPARTMENT_OTHER)
Admission: EM | Admit: 2024-04-04 | Discharge: 2024-04-04 | Disposition: A | Discharge status: Home / Self Care | Payer: Worker's Compensation | Attending: Emergency Medicine | Admitting: Emergency Medicine

## 2024-04-04 ENCOUNTER — Other Ambulatory Visit: Payer: Self-pay

## 2024-04-04 DIAGNOSIS — Z7982 Long term (current) use of aspirin: Secondary | ICD-10-CM | POA: Insufficient documentation

## 2024-04-04 DIAGNOSIS — Z91040 Latex allergy status: Secondary | ICD-10-CM | POA: Diagnosis not present

## 2024-04-04 DIAGNOSIS — Y92410 Unspecified street and highway as the place of occurrence of the external cause: Secondary | ICD-10-CM | POA: Diagnosis not present

## 2024-04-04 DIAGNOSIS — S39012A Strain of muscle, fascia and tendon of lower back, initial encounter: Secondary | ICD-10-CM | POA: Insufficient documentation

## 2024-04-04 DIAGNOSIS — S3992XA Unspecified injury of lower back, initial encounter: Secondary | ICD-10-CM | POA: Diagnosis present

## 2024-04-04 LAB — PREGNANCY, URINE: Preg Test, Ur: NEGATIVE

## 2024-04-04 MED ORDER — PREDNISONE 50 MG PO TABS
60.0000 mg | ORAL_TABLET | Freq: Once | ORAL | Status: AC
  Administered 2024-04-04: 60 mg via ORAL
  Filled 2024-04-04: qty 1

## 2024-04-04 MED ORDER — CYCLOBENZAPRINE HCL 10 MG PO TABS: 10.0000 mg | ORAL_TABLET | Freq: Two times a day (BID) | ORAL | 0 refills | Status: AC | PRN

## 2024-04-04 MED ORDER — PREDNISONE 10 MG PO TABS: 40.0000 mg | ORAL_TABLET | Freq: Every day | ORAL | 0 refills | Status: AC

## 2024-04-04 MED ORDER — IBUPROFEN 800 MG PO TABS
800.0000 mg | ORAL_TABLET | Freq: Once | ORAL | Status: AC
  Administered 2024-04-04: 800 mg via ORAL
  Filled 2024-04-04: qty 1

## 2024-04-04 MED ORDER — IBUPROFEN 600 MG PO TABS: 600.0000 mg | ORAL_TABLET | Freq: Four times a day (QID) | ORAL | 0 refills | Status: AC | PRN
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
## 2024-04-04 NOTE — ED Notes (Signed)
 Dc instructions given, pt verbalized understanding. Out of ED with steady gait not in visible distress.
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
## 2024-04-04 NOTE — Discharge Instructions (Addendum)
 Do not drive or operate heavy machinery or perform dangerous activities after taking Flexeril.  This is a muscle relaxer.  It can make you drowsy.  Do not mix this medication with alcohol or any other sedatives.
